# Patient Record
Sex: Female | Born: 1970 | Race: Black or African American | Hispanic: No | Smoking: Never smoker
Health system: Southern US, Community
[De-identification: ages and names within clinical notes are randomized; demographics above are authoritative.]

## PROBLEM LIST (undated history)

## (undated) DIAGNOSIS — I10 Essential (primary) hypertension: Secondary | ICD-10-CM

## (undated) DIAGNOSIS — Z87442 Personal history of urinary calculi: Secondary | ICD-10-CM

## (undated) HISTORY — PX: OVARIAN CYST REMOVAL: SHX89

## (undated) HISTORY — PX: DILATION AND CURETTAGE OF UTERUS: SHX78

---

## 1994-06-13 HISTORY — PX: BUNIONECTOMY: SHX129

## 1998-06-29 ENCOUNTER — Other Ambulatory Visit: Admission: RE | Admit: 1998-06-29 | Discharge: 1998-06-29 | Payer: Self-pay | Admitting: Obstetrics and Gynecology

## 1999-06-01 ENCOUNTER — Emergency Department (HOSPITAL_COMMUNITY): Admission: EM | Admit: 1999-06-01 | Discharge: 1999-06-01 | Payer: Self-pay | Admitting: *Deleted

## 2000-03-14 ENCOUNTER — Other Ambulatory Visit: Admission: RE | Admit: 2000-03-14 | Discharge: 2000-03-14 | Payer: Self-pay | Admitting: Obstetrics and Gynecology

## 2000-04-14 ENCOUNTER — Emergency Department (HOSPITAL_COMMUNITY): Admission: EM | Admit: 2000-04-14 | Discharge: 2000-04-15 | Payer: Self-pay | Admitting: Internal Medicine

## 2000-06-10 ENCOUNTER — Inpatient Hospital Stay (HOSPITAL_COMMUNITY): Admission: AD | Admit: 2000-06-10 | Discharge: 2000-06-10 | Payer: Self-pay | Admitting: Obstetrics and Gynecology

## 2000-09-14 ENCOUNTER — Inpatient Hospital Stay (HOSPITAL_COMMUNITY): Admission: AD | Admit: 2000-09-14 | Discharge: 2000-09-14 | Payer: Self-pay | Admitting: *Deleted

## 2000-10-03 ENCOUNTER — Inpatient Hospital Stay (HOSPITAL_COMMUNITY): Admission: AD | Admit: 2000-10-03 | Discharge: 2000-10-05 | Payer: Self-pay | Admitting: Obstetrics and Gynecology

## 2000-10-07 ENCOUNTER — Encounter: Admission: RE | Admit: 2000-10-07 | Discharge: 2000-11-06 | Payer: Self-pay | Admitting: Obstetrics and Gynecology

## 2000-12-07 ENCOUNTER — Encounter: Admission: RE | Admit: 2000-12-07 | Discharge: 2001-01-06 | Payer: Self-pay | Admitting: Obstetrics and Gynecology

## 2002-02-01 ENCOUNTER — Other Ambulatory Visit: Admission: RE | Admit: 2002-02-01 | Discharge: 2002-02-01 | Payer: Self-pay | Admitting: Obstetrics and Gynecology

## 2003-06-14 HISTORY — PX: TUBAL LIGATION: SHX77

## 2003-09-22 ENCOUNTER — Other Ambulatory Visit: Admission: RE | Admit: 2003-09-22 | Discharge: 2003-09-22 | Payer: Self-pay | Admitting: *Deleted

## 2003-11-11 ENCOUNTER — Encounter (INDEPENDENT_AMBULATORY_CARE_PROVIDER_SITE_OTHER): Payer: Self-pay | Admitting: *Deleted

## 2003-11-11 ENCOUNTER — Inpatient Hospital Stay (HOSPITAL_COMMUNITY): Admission: RE | Admit: 2003-11-11 | Discharge: 2003-11-14 | Payer: Self-pay | Admitting: *Deleted

## 2003-11-11 ENCOUNTER — Encounter (INDEPENDENT_AMBULATORY_CARE_PROVIDER_SITE_OTHER): Payer: Self-pay | Admitting: Specialist

## 2005-06-13 HISTORY — PX: ABLATION: SHX5711

## 2005-09-13 ENCOUNTER — Other Ambulatory Visit: Admission: RE | Admit: 2005-09-13 | Discharge: 2005-09-13 | Payer: Self-pay | Admitting: Obstetrics & Gynecology

## 2005-10-05 ENCOUNTER — Ambulatory Visit (HOSPITAL_COMMUNITY): Admission: RE | Admit: 2005-10-05 | Discharge: 2005-10-05 | Payer: Self-pay | Admitting: Obstetrics & Gynecology

## 2005-12-06 ENCOUNTER — Ambulatory Visit (HOSPITAL_BASED_OUTPATIENT_CLINIC_OR_DEPARTMENT_OTHER): Admission: RE | Admit: 2005-12-06 | Discharge: 2005-12-06 | Payer: Self-pay | Admitting: Obstetrics & Gynecology

## 2006-09-15 ENCOUNTER — Other Ambulatory Visit: Admission: RE | Admit: 2006-09-15 | Discharge: 2006-09-15 | Payer: Self-pay | Admitting: Obstetrics & Gynecology

## 2008-03-02 ENCOUNTER — Emergency Department (HOSPITAL_COMMUNITY): Admission: EM | Admit: 2008-03-02 | Discharge: 2008-03-02 | Payer: Self-pay | Admitting: Emergency Medicine

## 2008-03-10 ENCOUNTER — Other Ambulatory Visit: Admission: RE | Admit: 2008-03-10 | Discharge: 2008-03-10 | Payer: Self-pay | Admitting: Obstetrics & Gynecology

## 2008-07-26 ENCOUNTER — Emergency Department (HOSPITAL_COMMUNITY): Admission: EM | Admit: 2008-07-26 | Discharge: 2008-07-26 | Payer: Self-pay | Admitting: Emergency Medicine

## 2009-03-26 ENCOUNTER — Encounter: Admission: RE | Admit: 2009-03-26 | Discharge: 2009-03-26 | Payer: Self-pay | Admitting: Obstetrics and Gynecology

## 2010-01-24 ENCOUNTER — Emergency Department (HOSPITAL_COMMUNITY): Admission: EM | Admit: 2010-01-24 | Discharge: 2010-01-24 | Payer: Self-pay | Admitting: Emergency Medicine

## 2010-03-31 ENCOUNTER — Encounter: Admission: RE | Admit: 2010-03-31 | Discharge: 2010-03-31 | Payer: Self-pay | Admitting: Obstetrics and Gynecology

## 2010-10-29 NOTE — H&P (Signed)
NAME:  CHRISTEE, MERVINE                     ACCOUNT NO.:  [id number]   MEDICAL RECORD NO.:  [id number]                   PATIENT TYPE:  INP   LOCATION:  NA                                   FACILITY:  [facility]   PHYSICIAN:  Dineen Kid. Rana Snare, M.D.                 DATE OF BIRTH:  18-Mar-1971   DATE OF ADMISSION:  DATE OF DISCHARGE:                                HISTORY & PHYSICAL   HISTORY OF PRESENT ILLNESS:  Ms. Vanengen is a 40 year old G3, P2, A1, who  presents for surgical evaluation and treatment of a right ovarian mass.  She  presented to Dr. Tracie Harrier with menorrhagia, who found a pelvic mass  on a pelvic ultrasound, which showed an 11.7 x 6.5 x 11.0-cm pelvic mass  which was suspicious for a dermoid tumor.  She presents for definitive  surgical evaluation and removal of this tumor.  Her CA125 was 8.8.  She has  had a pressure discomfort.  She has no childbearing desires and also wishes  left tubal ligation at the same time.   PAST MEDICAL HISTORY:  Her past medical history is negative __________.   DICTATION ENDED AT THIS POINT.                                               Dineen Kid Rana Snare, M.D.    DCL/MEDQ  D:  11/10/2003  T:  11/11/2003  Job:  [id number]

## 2010-10-29 NOTE — Op Note (Signed)
[name], [name]           ACCOUNT NO.:  [id number]   MEDICAL RECORD NO.:  [id number]          PATIENT TYPE:  AMB   LOCATION:  [facility]                         FACILITY:  [facility]   PHYSICIAN:  M. Leda Quail, MD  DATE OF BIRTH:  [birth year]   DATE OF PROCEDURE:  12/06/2005  DATE OF DISCHARGE:                                 OPERATIVE REPORT   PREOPERATIVE DIAGNOSES:  1.  The patient is a 40 year old, G3, P2, A1, African-American female with      menorrhagia.  2.  Status post a bilateral tubal ligation.  3.  Normal ultrasound with a negative endometrial biopsy.   POSTOPERATIVE DIAGNOSES:  1.  The patient is a 40 year old, G3, P2, A1, African-American female with      menorrhagia.  2.  Status post a bilateral tubal ligation.  3.  Normal ultrasound with a negative endometrial biopsy.   PROCEDURE:  Hydrothermal endometrial ablation.   SURGEON:  M. Leda Quail, MD.   ANESTHESIA:  General endotracheal anesthesia.   SPECIMENS:  None.   ESTIMATED BLOOD LOSS:  Minimal.   URINE OUTPUT:  50 mL of clear urine drained with an in and out Foley  catheter at the beginning of the case.   FLUIDS:  800 mL of LR administered through the IV.   COMPLICATIONS:  None.   INDICATIONS FOR PROCEDURE:  [name] is a 40 year old, G3, P2, A1  African-American female who has had a tubal ligation. She initially saw me  several months ago reporting heavy menstrual cycles, passing clots and  worsening length of flow. Ultrasound was performed which was essentially  negative. She had a slightly thickened endometrium. An endometrial biopsy  was performed which was benign. Treatment options were discussed with the  patient and she opted for an endometrial ablation. She has been on birth  control pills in the past and did not want to resume these. She was  pretreated with Depot Lupron and then a preop ultrasound was performed and  showed continued mildly thickened endometrium so Aygestin was  also  initiated. She was aware that if the endometrium appeared very fluffy today  that I would not proceed with the procedure.   DESCRIPTION OF PROCEDURE:  The patient was taken to the operating room, she  was placed in the supine position. A running IV was then placed. Informed  consent was present on the chart. General endotracheal anesthesia was  administered by anesthesia staff without difficulty. Legs were positioned in  the low lithotomy position in [place] stirrups. Once the legs were positioned  correct, then legs were positioned in the high lithotomy position. The  perineum, inner thighs and vagina were prepped and draped in a normal  sterile fashion. A red rubber Foley catheter was used to drain the bladder  of all urine. A bivalve speculum was placed in the vagina and cervix was  well visualized. A tenaculum was applied to the anterior lip of the cervix.  The uterus sounded to 8.5 cm. Then using Pratt dilators, the cervix was  dilated up to a #25. The cervix did have a tight rubbery consistency to it.  Then the hysteroscope was advanced into the endocervical canal. There was a  tight seal around the scope. The tubal ostia were noted bilaterally and  photo documentation was made. A flush cycle with the hysteroscope and HTA  apparatus was performed. The hysteroscope was placed in the lower uterine  segment.  There was no fluid leaking from the cervix noted. At this point,  the system filled and the reservoir was at 80 mL. Then a heating cycle was  performed and the saline solution used for the procedure was heated to 90  degrees Celsius. Then a 10 minute ablation was performed. Direct  visualization of the endometrial cavity was observed throughout the entire  ablative process. Excellent blanching of the endometrium was noted. There  were no complications during the ablation. The reservoir stayed completely  stable at 80 mL and there was no leaking of fluid from the cervix noted.   Once a 10 minute ablative cycle was complete then a 2 minute cooling cycle  was performed. At this point, the hysteroscope was removed from the cervix.  Photo documentation was made prior to removal of the hysteroscope. The  tenaculum was removed from the anterior lip of the cervix and there was a  small amount of the bleeding from the tenaculum sites which was made  hemostatic easily. The speculum was then removed from the vagina and the  patient was positioned back in supine position.   Sponge, lap and instrument counts were correct x2. No needles were used  during the procedure. The patient tolerated the procedure well and was taken  to the recovery room in stable condition.      Lum Keas, MD  Electronically Signed     MSM/MEDQ  D:  12/06/2005  T:  12/06/2005  Job:  [id number]

## 2010-10-29 NOTE — H&P (Signed)
NAME:  [name], [name]                     ACCOUNT NO.:  [id number]   MEDICAL RECORD NO.:  [id number]                   PATIENT TYPE:  INP   LOCATION:  NA                                   FACILITY:  [facility]   PHYSICIAN:  Dineen Kid. Rana Snare, M.D.                 DATE OF BIRTH:  1971/06/06   DATE OF ADMISSION:  11/11/2003  DATE OF DISCHARGE:                                HISTORY & PHYSICAL   HISTORY OF PRESENT ILLNESS:  [name] is a 40 year old G3, P2, A1 who  presents for surgical evaluation and removal of a right ovarian mass.  The  patient initially presented to Dr. Malachy Mood for evaluation of menorrhagia.  Pelvic exam revealed an enlarged pelvic mass.  Ultrasound was performed  showing an 11.5 cm complex mass suspicious for a dermoid.  CA-125 was 8.8.  She presents for definitive surgical evaluation and treatment of this.  She  also desires sterility and planned left tubal ligation at the same time.   PAST MEDICAL HISTORY:  The patient's past medical history is negative.   PAST SURGICAL HISTORY:  History of bone spur removal and also D&E with a  miscarriage.   PAST OBSTETRICAL HISTORY:  Two vaginal deliveries.   MEDICATIONS:  None.   ALLERGIES:  She has no known drug allergies.   PHYSICAL EXAMINATION:  VITAL SIGNS:  Blood pressure is 112/90.  HEART:  Regular rate and rhythm.  LUNGS:  Clear to auscultation bilaterally.  ABDOMEN:  Nondistended, nontender.  PELVIC:  Pelvic mass.  Globular, approximately 8 weeks size.   LABORATORY DATA:  Ultrasound shows a right adnexal mass measuring 11.7 x 6.5  x 11.0 complex mass filled with low level echoes containing solid areas  suspicious for dermoid.  The left ovary is within normal limits; no ascites  is noted.  Again a CA-125 was 8.8.  Pap smear was normal.   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   PLAN:  Laparotomy with removal of right tube and ovary.  The risks and  benefits of the procedure were discussed which  include but are not limited  to risks of infection, bleeding, damage to bowel, bladder, ureters, ovary,  possibility of further surgery for staging procedure if this does turn out  to be cancer.  The patient also understands that we may perform a staging  procedure if this is cancer at the time or indicated surgeries.  She  understands the risks of infection and also of bleeding and risks of  associated blood transfusion.  She also desires sterility and planned left  tubal ligation at the time of the procedure.  She understands that there is  a 10/998 failure rate of tubal ligation.  She does give her informed consent  and wishes to proceed.  Dineen Kid Rana Snare, M.D.   DCL/MEDQ  D:  11/10/2003  T:  11/10/2003  Job:  [id number]

## 2010-10-29 NOTE — Op Note (Signed)
NAME:  [name], [name]                     ACCOUNT NO.:  [id number]   MEDICAL RECORD NO.:  [id number]                   PATIENT TYPE:  INP   LOCATION:  NA                                   FACILITY:  [facility]   PHYSICIAN:  Dineen Kid. Rana Snare, M.D.                 DATE OF BIRTH:  05/19/1971   DATE OF PROCEDURE:  11/11/2003  DATE OF DISCHARGE:                                 OPERATIVE REPORT   PREOPERATIVE DIAGNOSES:  1. 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  2. Desired sterility.   POSTOPERATIVE DIAGNOSES:  1. 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  2. Desired sterility.   PROCEDURE:  Laparotomy with right salpingo-oophorectomy and left tubal  ligation.   SURGEON:  Dineen Kid. Rana Snare, M.D.   ASSISTANTFreddy Finner, M.D.   ANESTHESIA:  General endotracheal.   INDICATIONS:  Ms. Satcher is a 40 year old G3, P2, A1, who was found to  have a right ovarian mass by ultrasound with a normal CA125, 8.8.  Ultrasound shows an 11.6 x 5.5 x 11.0 complex mass suspicious for a dermoid.  She presents for surgical evaluation and removal of this and planned right  salpingo-oophorectomy.  The patient also desires sterility.  Plan left tubal  ligation.  The patient understands the risks and benefits of surgery and  also tubal failure quoted as five out of 1000.  She gives her informed  consent.   Findings at the time of surgery were a large right ovarian mass consistent  with dermoid, approximately 11-12 cm in size, normal-appearing left ovary,  left tube, uterus, cul-de-sac.   SPECIMENS:  Right tube and ovary, portion of left fallopian tube.   DESCRIPTION OF PROCEDURE:  After adequate anesthesia, the patient placed in  the supine position.  She was sterilely prepped and draped.  A Foley  catheter was sterilely placed.  A Pfannenstiel incision was made two  fingerbreadths above the pubic symphysis, taken down sharply to the fascia,  incised transversely and extended  superiorly, then inferiorly off the  bellies of the rectus muscle, which were separated sharply in the midline.  The peritoneum was entered sharply, pelvic washings were obtained.  A large  right ovarian mass was noted.  The right uterosacral ligament was  identified, clamped with a Kelly clamp, doubly ligated with 0 Vicryl.  The  right infundibulopelvic ligament was identified, clamped with a Kelly clamp,  and then cut and ligated with a 0 Vicryl suture, and the remaining portion  of the pedicle was then clamped and cut and that ovary and tube were removed  intact and sent off to pathology.  The pedicle was ligated with 0 Vicryl  suture, noted to be hemostatic.  The ureter was noted to be well away from  the pedicle.  The left fallopian tube was identified by the fimbriated end,  the midportion of the tube grasped with a Babcock clamp.  A modified Pomeroy-  type tubal was performed with two sutures of 0 plain, central portion of the  tube excised and sent to pathology.  The tubal ostia were made hemostatic  with Bovie cautery.  The proximal portion of the tube was ligated with 2-0  silk.  Irrigation was applied and after adequate hemostasis of the pedicles  was assured, packing was removed, the O'Connor-O'Sullivan retractor was then  removed.  The peritoneum was then closed with 2-0 Vicryl in a running  fashion.  Rectus muscles were plicated in the midline.  The fascia was then  closed with a #1 Vicryl in running fashion, irrigation was applied, and  after adequate hemostasis the skin was stapled and Steri-Strips applied.  The patient tolerated the procedure well and was stable on transfer to the  recovery room.  The sponge and instrument count was normal x3.  The patient  received 1 g of Rocephin preoperatively.  Estimated blood loss 25 mL.                                               Dineen Kid Rana Snare, M.D.    DCL/MEDQ  D:  11/11/2003  T:  11/11/2003  Job:  [id number]

## 2010-10-29 NOTE — Discharge Summary (Signed)
NAME:  [name], [name]                     ACCOUNT NO.:  [id number]   MEDICAL RECORD NO.:  [id number]                   PATIENT TYPE:  INP   LOCATION:  9309                                 FACILITY:  [facility]   PHYSICIAN:  Dineen Kid. Rana Snare, M.D.                 DATE OF BIRTH:  1970-09-22   DATE OF ADMISSION:  11/11/2003  DATE OF DISCHARGE:  11/14/2003                                 DISCHARGE SUMMARY   HISTORY OF PRESENT ILLNESS:  [name] is a 40 year old G3, P2, A1 who  presents for surgical evaluation and removal of right ovarian mass. Pelvic  examination per Dr. Malachy Mood on presentation to the office was significant for  enlarged pelvic mass. Ultrasound shows a 11.5 cm complex mass, suspicious  for a dermoid. A normal CA125. She presents for definitive surgical  evaluation and treatment of this. She also plans left tubal  ligation and  she desires sterility.   HOSPITAL COURSE:  The patient underwent laparotomy with right salpingo-  oophorectomy for a 12 cm ovarian cyst, which is consistent with a dermoid.  The left ovary was normal at that time. The left tubal ligation was  performed. The surgery was uncomplicated with the estimated blood loss 25  cc. Her postoperative course was unremarkable with good return of bowel  function. Her postoperative hemoglobin was 10.2. By postoperative day 3, she  was tolerating a regular diet and ambulating without difficulty. The  incision was clean, dry and intact and she was discharged home.   DISPOSITION:  The patient will be discharged home.   FOLLOW UP:  In the office in 2 weeks.   DISCHARGE INSTRUCTIONS:  Sent home with routine instruction sheet for  laparotomy. Told to return for increased pain, fever, or bleeding. Staples  were removed before discharge.   DISCHARGE MEDICATIONS:  She was give a prescription for Tylox #30.                                               Dineen Kid Rana Snare, M.D.    DCL/MEDQ  D:  11/14/2003  T:  11/16/2003   Job:  [id number]   cc:   Tracie Harrier, M.D.  [address] [place]  [place] [address]  Fax: [phone]

## 2011-06-14 ENCOUNTER — Emergency Department (HOSPITAL_COMMUNITY)
Admission: EM | Admit: 2011-06-14 | Discharge: 2011-06-14 | Disposition: A | Discharge status: Home / Self Care | Payer: BC Managed Care – PPO | Source: Home / Self Care | Attending: Family Medicine | Admitting: Family Medicine

## 2011-06-14 ENCOUNTER — Encounter: Payer: Self-pay | Admitting: *Deleted

## 2011-06-14 DIAGNOSIS — I781 Nevus, non-neoplastic: Secondary | ICD-10-CM

## 2011-06-14 NOTE — ED Notes (Signed)
Left lower lip ? Cold sore onset x several weeks more painful this am

## 2011-06-14 NOTE — ED Provider Notes (Signed)
History     CSN: [id number]  Arrival date & time 06/14/11  1050   First MD Initiated Contact with Patient 06/14/11 1121      Chief Complaint  Patient presents with  . Mouth Lesions    (Consider location/radiation/quality/duration/timing/severity/associated sxs/prior treatment) Patient is a 41 y.o. female presenting with rash. The history is provided by the patient.  Rash  This is a new problem. The current episode started more than 1 week ago (2-3 weeks). The problem has been gradually worsening. The problem is associated with an unknown factor. There has been no fever. The rash is present on the lips. The pain is mild. Associated symptoms include pain.    History reviewed. No pertinent past medical history.  Past Surgical History  Procedure Date  . Ovarian cyst removal     History reviewed. No pertinent family history.  History  Substance Use Topics  . Smoking status: Current Everyday Smoker  . Smokeless tobacco: Not on file  . Alcohol Use: Yes    OB History    Grav Para Term Preterm Abortions TAB SAB Ect Mult Living                  Review of Systems  Constitutional: Negative.   HENT: Positive for mouth sores.   Skin: Positive for rash.    Allergies  Review of patient's allergies indicates no known allergies.  Home Medications   Current Outpatient Rx  Name Route Sig Dispense Refill  . VITAMIN D 1000 UNITS PO TABS Oral Take 1,000 Units by mouth daily.      . ADULT MULTIVITAMIN W/MINERALS CH Oral Take 1 tablet by mouth daily.        BP 135/92  Pulse 76  Temp(Src) 98.7 F (37.1 C) (Oral)  Resp 16  SpO2 100%  Physical Exam  Nursing note and vitals reviewed. Constitutional: She appears well-developed and well-nourished.  HENT:  Head: Normocephalic.  Left Ear: External ear normal.  Mouth/Throat: Oropharynx is clear and moist.  Neck: Normal range of motion. Neck supple.  Lymphadenopathy:    She has no cervical adenopathy.  Skin: Skin is warm and  dry.       ED Course  Procedures (including critical care time)  Labs Reviewed - No data to display No results found.   1. Capillary angioma       MDM          Barkley Bruns, MD 06/14/11 1320

## 2011-06-15 ENCOUNTER — Other Ambulatory Visit: Payer: Self-pay | Admitting: Dermatology

## 2012-06-14 ENCOUNTER — Other Ambulatory Visit: Payer: Self-pay | Admitting: Obstetrics and Gynecology

## 2012-06-14 DIAGNOSIS — Z1231 Encounter for screening mammogram for malignant neoplasm of breast: Secondary | ICD-10-CM

## 2012-06-15 ENCOUNTER — Ambulatory Visit
Admission: RE | Admit: 2012-06-15 | Discharge: 2012-06-15 | Disposition: A | Discharge status: Home / Self Care | Payer: BC Managed Care – PPO | Source: Ambulatory Visit | Attending: Obstetrics and Gynecology | Admitting: Obstetrics and Gynecology

## 2012-06-15 DIAGNOSIS — Z1231 Encounter for screening mammogram for malignant neoplasm of breast: Secondary | ICD-10-CM

## 2013-05-29 ENCOUNTER — Other Ambulatory Visit: Payer: Self-pay

## 2013-05-29 DIAGNOSIS — Z1231 Encounter for screening mammogram for malignant neoplasm of breast: Secondary | ICD-10-CM

## 2013-06-17 ENCOUNTER — Ambulatory Visit
Admission: RE | Admit: 2013-06-17 | Discharge: 2013-06-17 | Disposition: A | Discharge status: Home / Self Care | Payer: BC Managed Care – PPO | Source: Ambulatory Visit

## 2013-06-17 DIAGNOSIS — Z1231 Encounter for screening mammogram for malignant neoplasm of breast: Secondary | ICD-10-CM

## 2013-10-20 ENCOUNTER — Emergency Department (HOSPITAL_COMMUNITY)
Admission: EM | Admit: 2013-10-20 | Discharge: 2013-10-20 | Disposition: A | Discharge status: Home / Self Care | Payer: BC Managed Care – PPO | Attending: Emergency Medicine | Admitting: Emergency Medicine

## 2013-10-20 ENCOUNTER — Encounter (HOSPITAL_COMMUNITY): Payer: Self-pay | Admitting: Emergency Medicine

## 2013-10-20 ENCOUNTER — Emergency Department (HOSPITAL_COMMUNITY): Payer: BC Managed Care – PPO

## 2013-10-20 DIAGNOSIS — Y9389 Activity, other specified: Secondary | ICD-10-CM | POA: Insufficient documentation

## 2013-10-20 DIAGNOSIS — S91309A Unspecified open wound, unspecified foot, initial encounter: Secondary | ICD-10-CM | POA: Insufficient documentation

## 2013-10-20 DIAGNOSIS — Y929 Unspecified place or not applicable: Secondary | ICD-10-CM | POA: Insufficient documentation

## 2013-10-20 DIAGNOSIS — W268XXA Contact with other sharp object(s), not elsewhere classified, initial encounter: Secondary | ICD-10-CM | POA: Insufficient documentation

## 2013-10-20 DIAGNOSIS — S91331A Puncture wound without foreign body, right foot, initial encounter: Secondary | ICD-10-CM

## 2013-10-20 NOTE — ED Notes (Signed)
Pt was cleaning son's room and stepped on a piece of glass.

## 2013-10-20 NOTE — Discharge Instructions (Signed)
Keep wound clean with soap and warm water. Follow up with your primary care physician. Return to the ED for new concerns.

## 2013-10-20 NOTE — ED Provider Notes (Signed)
CSN: [id number]     Arrival date & time 10/20/13  1752 History  This chart was scribed for non-physician practitioner Sharilyn SitesLisa Lourene Hoston, PA-C, working with Rolland PorterMark James, MD, by Yevette EdwardsAngela Bracken, ED Scribe. This patient was seen in room WTR5/WTR5 and the patient's care was started at 7:06 PM.   First MD Initiated Contact with Patient 10/20/13 1856     Chief Complaint  Patient presents with  . Extremity Laceration    The history is provided by the patient. No language interpreter was used.   HPI Comments: Mckenzie PaciniMichelle A Holmes is a 43 y.o. female who presents to the Emergency Department complaining of a puncture wound to the sole of her right foot which occurred this afternoon when she stepped on a piece of glass as she was cleaning her son's room. The bleeding is controlled. She is able to move her toes and she denies loss of sensation to her foot. Her last tetanus booster was approximately four years ago.   History reviewed. No pertinent past medical history. Past Surgical History  Procedure Laterality Date  . Ovarian cyst removal     History reviewed. No pertinent family history. History  Substance Use Topics  . Smoking status: Never Smoker   . Smokeless tobacco: Not on file  . Alcohol Use: Yes    No OB history provided.  Review of Systems  Constitutional: Negative for fever.  Skin: Positive for wound.  All other systems reviewed and are negative.   Allergies  Tomato  Home Medications   Prior to Admission medications   Medication Sig Start Date End Date Taking? Authorizing Provider  Multiple Vitamin (MULITIVITAMIN WITH MINERALS) TABS Take 1 tablet by mouth daily.     Yes Historical Provider, MD   Triage Vitals: BP 145/90  Pulse 85  Temp(Src) 98.2 F (36.8 C) (Oral)  Resp 18  SpO2 98%  Physical Exam  Nursing note and vitals reviewed. Constitutional: She is oriented to person, place, and time. She appears well-developed and well-nourished. No distress.  HENT:  Head:  Normocephalic and atraumatic.  Eyes: EOM are normal.  Neck: Neck supple. No tracheal deviation present.  Cardiovascular: Normal rate.   Pulmonary/Chest: Effort normal. No respiratory distress.  Musculoskeletal: Normal range of motion.  Neurological: She is alert and oriented to person, place, and time.  Skin: Skin is warm and dry.  Right sole of foot with small puncture wound; surrounding tissue tender to palpation. No foreign body or active bleeding. No drainage.   Psychiatric: She has a normal mood and affect. Her behavior is normal.    ED Course  Procedures (including critical care time)  DIAGNOSTIC STUDIES: Oxygen Saturation is 98% on room air, normal by my interpretation.    COORDINATION OF CARE:  7:10 PM- Discussed treatment plan with patient, and the patient agreed to the plan. The plan includes imaging.  Labs Review Labs Reviewed - No data to display  Imaging Review Dg Foot Complete Right  10/20/2013   CLINICAL DATA:  Stepped on a piece of glass, laceration to the right foot.  EXAM: RIGHT FOOT COMPLETE - 3+ VIEW  COMPARISON:  None.  FINDINGS: No evidence of acute fracture or dislocation. Mild hallux valgus and mild soft tissue swelling at the level of the 1st MTP joint laterally. No visible opaque foreign body in the soft tissues to suggest a glass shard. Wire suture involving the proximal phalanx of the great toe. Joint spaces well preserved. Bone mineral density well-preserved.  IMPRESSION: No acute osseous abnormality.  No opaque foreign bodies in the soft tissues.   Electronically Signed   By: Hulan Saashomas  Lawrence M.D.   On: 10/20/2013 18:34     EKG Interpretation None      MDM   Final diagnoses:  Puncture wound of right foot   His tetanus is up to date. X-ray negative for retained foreign bodies. On exam wound is clean without signs of infection.  Wound was cleaned and bandaged.  Instructed on home wound care.  FU with PCP if problems occur.  Discussed plan with  patient, he/she acknowledged understanding and agreed with plan of care.  Return precautions given for new or worsening symptoms.  I personally performed the services described in this documentation, which was scribed in my presence. The recorded information has been reviewed and is accurate.  Garlon HatchetLisa M Rhyder Koegel, PA-C 10/20/13 [phone]

## 2013-10-23 NOTE — ED Provider Notes (Signed)
Medical screening examination/treatment/procedure(s) were performed by non-physician practitioner and as supervising physician I was immediately available for consultation/collaboration.   EKG Interpretation None        Jaimeson Gopal, MD 10/23/13 0833 

## 2014-07-24 ENCOUNTER — Other Ambulatory Visit: Payer: Self-pay | Admitting: Dermatology

## 2014-11-24 ENCOUNTER — Ambulatory Visit: Payer: BC Managed Care – PPO | Admitting: Dietician

## 2015-05-14 ENCOUNTER — Ambulatory Visit: Payer: BC Managed Care – PPO | Admitting: Internal Medicine

## 2015-05-14 DIAGNOSIS — Z0289 Encounter for other administrative examinations: Secondary | ICD-10-CM

## 2015-06-29 ENCOUNTER — Telehealth: Payer: Self-pay | Admitting: General Practice

## 2015-06-29 NOTE — Telephone Encounter (Signed)
Patient did not come in for December establish appointment with Okey Duprerawford.  Is asking to waive 50 service charge fee.

## 2015-07-03 NOTE — Telephone Encounter (Signed)
We havent charged her at this point

## 2015-07-09 NOTE — Telephone Encounter (Signed)
Notified patient that fee must hit billing before we can waive.

## 2015-07-28 ENCOUNTER — Ambulatory Visit: Payer: Self-pay | Admitting: Licensed Clinical Social Worker

## 2015-08-06 ENCOUNTER — Ambulatory Visit (INDEPENDENT_AMBULATORY_CARE_PROVIDER_SITE_OTHER): Payer: BC Managed Care – PPO | Admitting: Internal Medicine

## 2015-08-06 ENCOUNTER — Other Ambulatory Visit (INDEPENDENT_AMBULATORY_CARE_PROVIDER_SITE_OTHER): Payer: BC Managed Care – PPO

## 2015-08-06 ENCOUNTER — Encounter: Payer: Self-pay | Admitting: Internal Medicine

## 2015-08-06 VITALS — BP 128/92 | HR 89 | Temp 98.6°F | Resp 16 | Ht 66.0 in | Wt 203.1 lb

## 2015-08-06 DIAGNOSIS — Z0000 Encounter for general adult medical examination without abnormal findings: Secondary | ICD-10-CM

## 2015-08-06 DIAGNOSIS — E669 Obesity, unspecified: Secondary | ICD-10-CM

## 2015-08-06 DIAGNOSIS — I10 Essential (primary) hypertension: Secondary | ICD-10-CM | POA: Insufficient documentation

## 2015-08-06 DIAGNOSIS — J302 Other seasonal allergic rhinitis: Secondary | ICD-10-CM | POA: Diagnosis not present

## 2015-08-06 DIAGNOSIS — J309 Allergic rhinitis, unspecified: Secondary | ICD-10-CM | POA: Insufficient documentation

## 2015-08-06 DIAGNOSIS — R030 Elevated blood-pressure reading, without diagnosis of hypertension: Secondary | ICD-10-CM | POA: Diagnosis not present

## 2015-08-06 LAB — LIPID PANEL
Cholesterol: 227 mg/dL — ABNORMAL HIGH (ref 0–200)
HDL: 44.9 mg/dL (ref >39.00)
LDL Cholesterol: 166 mg/dL — ABNORMAL HIGH (ref 0–99)
NonHDL: 181.88
Total CHOL/HDL Ratio: 5
Triglycerides: 81 mg/dL (ref 0.0–149.0)
VLDL: 16.2 mg/dL (ref 0.0–40.0)

## 2015-08-06 LAB — COMPREHENSIVE METABOLIC PANEL
ALT: 15 U/L (ref 0–35)
AST: 26 U/L (ref 0–37)
Albumin: 4.4 g/dL (ref 3.5–5.2)
Alkaline Phosphatase: 57 U/L (ref 39–117)
BUN: 18 mg/dL (ref 6–23)
CO2: 27 mEq/L (ref 19–32)
Calcium: 9.5 mg/dL (ref 8.4–10.5)
Chloride: 102 mEq/L (ref 96–112)
Creatinine, Ser: 0.94 mg/dL (ref 0.40–1.20)
GFR: 82.22 mL/min (ref >60.00)
Glucose, Bld: 92 mg/dL (ref 70–99)
Potassium: 4.8 mEq/L (ref 3.5–5.1)
Sodium: 137 mEq/L (ref 135–145)
Total Bilirubin: 0.6 mg/dL (ref 0.2–1.2)
Total Protein: 8.4 g/dL — ABNORMAL HIGH (ref 6.0–8.3)

## 2015-08-06 LAB — CBC
HCT: 39.7 % (ref 36.0–46.0)
Hemoglobin: 13.5 g/dL (ref 12.0–15.0)
MCHC: 34.1 g/dL (ref 30.0–36.0)
MCV: 90.6 fl (ref 78.0–100.0)
Platelets: 418 10*3/uL — ABNORMAL HIGH (ref 150.0–400.0)
RBC: 4.38 Mil/uL (ref 3.87–5.11)
RDW: 12.5 % (ref 11.5–15.5)
WBC: 4.6 10*3/uL (ref 4.0–10.5)

## 2015-08-06 LAB — HEMOGLOBIN A1C: Hgb A1c MFr Bld: 4.7 % (ref 4.6–6.5)

## 2015-08-06 LAB — TSH: TSH: 1.78 u[IU]/mL (ref 0.35–4.50)

## 2015-08-06 MED ORDER — FLUTICASONE PROPIONATE 50 MCG/ACT NA SUSP: 2.0000 | Freq: Every day | NASAL | Status: DC

## 2015-08-06 NOTE — Progress Notes (Signed)
Pre visit review using our clinic review tool, if applicable. No additional management support is needed unless otherwise documented below in the visit note. 

## 2015-08-06 NOTE — Assessment & Plan Note (Signed)
Talked to her about diet and exercise needs to help her lose weight. She will work on more exercise.

## 2015-08-06 NOTE — Patient Instructions (Signed)
We have sent in the flonase which is the nose spray for the allergies. Use 2 sprays in each nostril daily during your allergy season.  We would like to see you back in about 6 months to check on the blood pressure.   To lose weight the recommended amount of exercise is 4-5 times per week for 45 minutes per time.   Health Maintenance, Female Adopting a healthy lifestyle and getting preventive care can go a long way to promote health and wellness. Talk with your health care provider about what schedule of regular examinations is right for you. This is a good chance for you to check in with your provider about disease prevention and staying healthy. In between checkups, there are plenty of things you can do on your own. Experts have done a lot of research about which lifestyle changes and preventive measures are most likely to keep you healthy. Ask your health care provider for more information. WEIGHT AND DIET  Eat a healthy diet  Be sure to include plenty of vegetables, fruits, low-fat dairy products, and lean protein.  Do not eat a lot of foods high in solid fats, added sugars, or salt.  Get regular exercise. This is one of the most important things you can do for your health.  Most adults should exercise for at least 150 minutes each week. The exercise should increase your heart rate and make you sweat (moderate-intensity exercise).  Most adults should also do strengthening exercises at least twice a week. This is in addition to the moderate-intensity exercise.  Maintain a healthy weight  Body mass index (BMI) is a measurement that can be used to identify possible weight problems. It estimates body fat based on height and weight. Your health care provider can help determine your BMI and help you achieve or maintain a healthy weight.  For females 26 years of age and older:   A BMI below 18.5 is considered underweight.  A BMI of 18.5 to 24.9 is normal.  A BMI of 25 to 29.9 is  considered overweight.  A BMI of 30 and above is considered obese.  Watch levels of cholesterol and blood lipids  You should start having your blood tested for lipids and cholesterol at 47 years of age, then have this test every 5 years.  You may need to have your cholesterol levels checked more often if:  Your lipid or cholesterol levels are high.  You are older than 47 years of age.  You are at high risk for heart disease.  CANCER SCREENING   Lung Cancer  Lung cancer screening is recommended for adults 26-36 years old who are at high risk for lung cancer because of a history of smoking.  A yearly low-dose CT scan of the lungs is recommended for people who:  Currently smoke.  Have quit within the past 15 years.  Have at least a 30-pack-year history of smoking. A pack year is smoking an average of one pack of cigarettes a day for 1 year.  Yearly screening should continue until it has been 15 years since you quit.  Yearly screening should stop if you develop a health problem that would prevent you from having lung cancer treatment.  Breast Cancer  Practice breast self-awareness. This means understanding how your breasts normally appear and feel.  It also means doing regular breast self-exams. Let your health care provider know about any changes, no matter how small.  If you are in your 20s or 30s, you  should have a clinical breast exam (CBE) by a health care provider every 1-3 years as part of a regular health exam.  If you are 21 or older, have a CBE every year. Also consider having a breast X-ray (mammogram) every year.  If you have a family history of breast cancer, talk to your health care provider about genetic screening.  If you are at high risk for breast cancer, talk to your health care provider about having an MRI and a mammogram every year.  Breast cancer gene (BRCA) assessment is recommended for women who have family members with BRCA-related cancers.  BRCA-related cancers include:  Breast.  Ovarian.  Tubal.  Peritoneal cancers.  Results of the assessment will determine the need for genetic counseling and BRCA1 and BRCA2 testing. Cervical Cancer Your health care provider may recommend that you be screened regularly for cancer of the pelvic organs (ovaries, uterus, and vagina). This screening involves a pelvic examination, including checking for microscopic changes to the surface of your cervix (Pap test). You may be encouraged to have this screening done every 3 years, beginning at age 68.  For women ages 66-65, health care providers may recommend pelvic exams and Pap testing every 3 years, or they may recommend the Pap and pelvic exam, combined with testing for human papilloma virus (HPV), every 5 years. Some types of HPV increase your risk of cervical cancer. Testing for HPV may also be done on women of any age with unclear Pap test results.  Other health care providers may not recommend any screening for nonpregnant women who are considered low risk for pelvic cancer and who do not have symptoms. Ask your health care provider if a screening pelvic exam is right for you.  If you have had past treatment for cervical cancer or a condition that could lead to cancer, you need Pap tests and screening for cancer for at least 20 years after your treatment. If Pap tests have been discontinued, your risk factors (such as having a new sexual partner) need to be reassessed to determine if screening should resume. Some women have medical problems that increase the chance of getting cervical cancer. In these cases, your health care provider may recommend more frequent screening and Pap tests. Colorectal Cancer  This type of cancer can be detected and often prevented.  Routine colorectal cancer screening usually begins at 47 years of age and continues through 47 years of age.  Your health care provider may recommend screening at an earlier age if you  have risk factors for colon cancer.  Your health care provider may also recommend using home test kits to check for hidden blood in the stool.  A small camera at the end of a tube can be used to examine your colon directly (sigmoidoscopy or colonoscopy). This is done to check for the earliest forms of colorectal cancer.  Routine screening usually begins at age 72.  Direct examination of the colon should be repeated every 5-10 years through 47 years of age. However, you may need to be screened more often if early forms of precancerous polyps or small growths are found. Skin Cancer  Check your skin from head to toe regularly.  Tell your health care provider about any new moles or changes in moles, especially if there is a change in a mole's shape or color.  Also tell your health care provider if you have a mole that is larger than the size of a pencil eraser.  Always use sunscreen.  Apply sunscreen liberally and repeatedly throughout the day.  Protect yourself by wearing long sleeves, pants, a wide-brimmed hat, and sunglasses whenever you are outside. HEART DISEASE, DIABETES, AND HIGH BLOOD PRESSURE   High blood pressure causes heart disease and increases the risk of stroke. High blood pressure is more likely to develop in:  People who have blood pressure in the high end of the normal range (130-139/85-89 mm Hg).  People who are overweight or obese.  People who are African American.  If you are 96-41 years of age, have your blood pressure checked every 3-5 years. If you are 12 years of age or older, have your blood pressure checked every year. You should have your blood pressure measured twice--once when you are at a hospital or clinic, and once when you are not at a hospital or clinic. Record the average of the two measurements. To check your blood pressure when you are not at a hospital or clinic, you can use:  An automated blood pressure machine at a pharmacy.  A home blood pressure  monitor.  If you are between 1 years and 15 years old, ask your health care provider if you should take aspirin to prevent strokes.  Have regular diabetes screenings. This involves taking a blood sample to check your fasting blood sugar level.  If you are at a normal weight and have a low risk for diabetes, have this test once every three years after 47 years of age.  If you are overweight and have a high risk for diabetes, consider being tested at a younger age or more often. PREVENTING INFECTION  Hepatitis B  If you have a higher risk for hepatitis B, you should be screened for this virus. You are considered at high risk for hepatitis B if:  You were born in a country where hepatitis B is common. Ask your health care provider which countries are considered high risk.  Your parents were born in a high-risk country, and you have not been immunized against hepatitis B (hepatitis B vaccine).  You have HIV or AIDS.  You use needles to inject street drugs.  You live with someone who has hepatitis B.  You have had sex with someone who has hepatitis B.  You get hemodialysis treatment.  You take certain medicines for conditions, including cancer, organ transplantation, and autoimmune conditions. Hepatitis C  Blood testing is recommended for:  Everyone born from [birth year] through 1967.  Anyone with known risk factors for hepatitis C. Sexually transmitted infections (STIs)  You should be screened for sexually transmitted infections (STIs) including gonorrhea and chlamydia if:  You are sexually active and are younger than 47 years of age.  You are older than 47 years of age and your health care provider tells you that you are at risk for this type of infection.  Your sexual activity has changed since you were last screened and you are at an increased risk for chlamydia or gonorrhea. Ask your health care provider if you are at risk.  If you do not have HIV, but are at risk, it may be  recommended that you take a prescription medicine daily to prevent HIV infection. This is called pre-exposure prophylaxis (PrEP). You are considered at risk if:  You are sexually active and do not regularly use condoms or know the HIV status of your partner(s).  You take drugs by injection.  You are sexually active with a partner who has HIV. Talk with your health care provider about  whether you are at high risk of being infected with HIV. If you choose to begin PrEP, you should first be tested for HIV. You should then be tested every 3 months for as long as you are taking PrEP.  PREGNANCY   If you are premenopausal and you may become pregnant, ask your health care provider about preconception counseling.  If you may become pregnant, take 400 to 800 micrograms (mcg) of folic acid every day.  If you want to prevent pregnancy, talk to your health care provider about birth control (contraception). OSTEOPOROSIS AND MENOPAUSE   Osteoporosis is a disease in which the bones lose minerals and strength with aging. This can result in serious bone fractures. Your risk for osteoporosis can be identified using a bone density scan.  If you are 35 years of age or older, or if you are at risk for osteoporosis and fractures, ask your health care provider if you should be screened.  Ask your health care provider whether you should take a calcium or vitamin D supplement to lower your risk for osteoporosis.  Menopause may have certain physical symptoms and risks.  Hormone replacement therapy may reduce some of these symptoms and risks. Talk to your health care provider about whether hormone replacement therapy is right for you.  HOME CARE INSTRUCTIONS   Schedule regular health, dental, and eye exams.  Stay current with your immunizations.   Do not use any tobacco products including cigarettes, chewing tobacco, or electronic cigarettes.  If you are pregnant, do not drink alcohol.  If you are  breastfeeding, limit how much and how often you drink alcohol.  Limit alcohol intake to no more than 1 drink per day for nonpregnant women. One drink equals 12 ounces of beer, 5 ounces of wine, or 1 ounces of hard liquor.  Do not use street drugs.  Do not share needles.  Ask your health care provider for help if you need support or information about quitting drugs.  Tell your health care provider if you often feel depressed.  Tell your health care provider if you have ever been abused or do not feel safe at home.   This information is not intended to replace advice given to you by your health care provider. Make sure you discuss any questions you have with your health care provider.   Document Released: 12/13/2010 Document Revised: 06/20/2014 Document Reviewed: 05/01/2013 Elsevier Interactive Patient Education [employer].

## 2015-08-06 NOTE — Progress Notes (Signed)
   Subjective:    Patient ID: [name], female    DOB: 1970/10/06, 45 y.o.   MRN: [id number]  HPI The patient is a new 45 YO female coming in for allergies. She does get severe allergies during the spring and wants to know what she can do about it. No fevers or chills right now. No headaches or pressure. Not taking any allergy medicine. Takes tylenol sinus during flares which is not working well. Had steroids back in January which helped significantly. She wants to know if she can have more today.   PMH, [facility], social history reviewed and updated.   Review of Systems  Constitutional: Negative for fever, chills, activity change, appetite change, fatigue and unexpected weight change.  HENT: Positive for congestion and postnasal drip. Negative for ear discharge, ear pain, rhinorrhea, sinus pressure, sore throat, tinnitus and voice change.   Respiratory: Positive for cough. Negative for chest tightness, shortness of breath and wheezing.        Mild from drainage  Cardiovascular: Negative for chest pain, palpitations and leg swelling.  Gastrointestinal: Negative for nausea, abdominal pain, diarrhea, constipation and abdominal distention.  Musculoskeletal: Negative.   Skin: Negative.   Neurological: Negative for dizziness, weakness, light-headedness, numbness and headaches.  Psychiatric/Behavioral: Negative.       Objective:   Physical Exam  Constitutional: She is oriented to person, place, and time. She appears well-developed and well-nourished.  HENT:  Head: Normocephalic and atraumatic.  Oropharynx with mild erythema, no CAD, no sinus tenderness, clear drainage  Eyes: EOM are normal.  Neck: Normal range of motion.  Cardiovascular: Normal rate and regular rhythm.   Pulmonary/Chest: Effort normal and breath sounds normal. No respiratory distress. She has no wheezes. She has no rales.  Abdominal: Soft. Bowel sounds are normal. She exhibits no distension. There is no tenderness.  There is no rebound.  Musculoskeletal: She exhibits no edema.  Neurological: She is alert and oriented to person, place, and time. Coordination normal.  Skin: Skin is warm and dry.  Psychiatric: She has a normal mood and affect.   Filed Vitals:   08/06/15 0857 08/06/15 0930  BP: 162/120 128/92  Pulse: 89   Temp: 98.6 F (37 C)   TempSrc: Oral   Resp: 16   Height:  (1.676 m)   Weight: 203 lb 1.9 oz (92.135 kg)   SpO2: 98%       Assessment & Plan:

## 2015-08-06 NOTE — Assessment & Plan Note (Signed)
Rx for flonase today for her allergies. Does not need steroids or steroid injection today and talked to her about the risks and harms from those long term on weight and bone health.

## 2015-08-06 NOTE — Assessment & Plan Note (Signed)
Will bring her back in 6 months for BP recheck. Initial high today but recheck very close to normal. She does have strong family history of hypertension and advised her that she will likely have problems with it at some time. She will work on weight loss in the meantime to help.

## 2015-09-07 ENCOUNTER — Telehealth: Payer: Self-pay | Admitting: Internal Medicine

## 2015-09-07 NOTE — Telephone Encounter (Signed)
Patient states the last time she was in to see Dr. Okey Duprerawford, Dr. Okey Duprerawford states if patient started to have issues with sinuses to let her know and she could call something in.  Patient is currently having a running nose with facial pressure,and congestion.  Patient was wanting to know if something could be sent over to [facility] on [address].

## 2015-09-07 NOTE — Telephone Encounter (Signed)
Is she using the flonase?

## 2015-09-08 NOTE — Telephone Encounter (Signed)
Left message for patient to call back. Need to ask her if she has been using the flonase.

## 2015-12-14 ENCOUNTER — Other Ambulatory Visit: Payer: Self-pay | Admitting: *Deleted

## 2015-12-14 MED ORDER — FLUTICASONE PROPIONATE 50 MCG/ACT NA SUSP: 2.0000 | Freq: Every day | NASAL | Status: DC

## 2016-01-19 ENCOUNTER — Ambulatory Visit: Payer: BC Managed Care – PPO | Admitting: Internal Medicine

## 2016-01-27 ENCOUNTER — Encounter: Payer: Self-pay | Admitting: Internal Medicine

## 2016-01-27 ENCOUNTER — Ambulatory Visit (INDEPENDENT_AMBULATORY_CARE_PROVIDER_SITE_OTHER): Payer: BC Managed Care – PPO | Admitting: Internal Medicine

## 2016-01-27 VITALS — BP 140/90 | HR 89 | Temp 98.4°F | Resp 14 | Ht 66.0 in | Wt 217.0 lb

## 2016-01-27 DIAGNOSIS — Z0000 Encounter for general adult medical examination without abnormal findings: Secondary | ICD-10-CM | POA: Diagnosis not present

## 2016-01-27 DIAGNOSIS — E669 Obesity, unspecified: Secondary | ICD-10-CM | POA: Diagnosis not present

## 2016-01-27 DIAGNOSIS — R030 Elevated blood-pressure reading, without diagnosis of hypertension: Secondary | ICD-10-CM

## 2016-01-27 NOTE — Progress Notes (Signed)
   Subjective:    Patient ID: Mckenzie PaciniMichelle A Holmes, female    DOB: September 14, 1970, 45 y.o.   MRN: [id number]  HPI The patient is a 45 YO female coming in for wellness.   [facility], social history reviewed and updated.   Review of Systems  Constitutional: Negative for activity change, appetite change, chills, fatigue, fever and unexpected weight change.  HENT: Negative for congestion, ear discharge, ear pain, postnasal drip, rhinorrhea, sinus pressure, sore throat, tinnitus and voice change.   Respiratory: Negative for cough, chest tightness, shortness of breath and wheezing.   Cardiovascular: Negative for chest pain, palpitations and leg swelling.  Gastrointestinal: Negative for abdominal distention, abdominal pain, constipation, diarrhea and nausea.  Musculoskeletal: Negative.   Skin: Negative.   Neurological: Negative for dizziness, weakness, light-headedness, numbness and headaches.  Psychiatric/Behavioral: Negative.       Objective:   Physical Exam  Constitutional: She is oriented to person, place, and time. She appears well-developed and well-nourished.  HENT:  Head: Normocephalic and atraumatic.  Right Ear: External ear normal.  Left Ear: External ear normal.  Mouth/Throat: Oropharynx is clear and moist.  Eyes: EOM are normal.  Glasses  Neck: Normal range of motion.  Cardiovascular: Normal rate and regular rhythm.   Pulmonary/Chest: Effort normal and breath sounds normal. No respiratory distress. She has no wheezes. She has no rales.  Abdominal: Soft. Bowel sounds are normal. She exhibits no distension. There is no tenderness. There is no rebound.  Musculoskeletal: She exhibits no edema.  Neurological: She is alert and oriented to person, place, and time. Coordination normal.  Skin: Skin is warm and dry.  Psychiatric: She has a normal mood and affect.   Vitals:   01/27/16 0942 01/27/16 1021  BP: (!) 160/102 140/90  Pulse: 89   Resp: 14   Temp: 98.4 F (36.9 C)   TempSrc:  Oral   SpO2: 98%   Weight: 217 lb (98.4 kg)   Height: 5\' 6"  (1.676 m)       Assessment & Plan:

## 2016-01-27 NOTE — Assessment & Plan Note (Signed)
BP again initially elevated but recheck is normal. She states monitoring at home is normal. Strong family history and will continue to monitor.

## 2016-01-27 NOTE — Progress Notes (Signed)
Pre visit review using our clinic review tool, if applicable. No additional management support is needed unless otherwise documented below in the visit note. 

## 2016-01-27 NOTE — Assessment & Plan Note (Signed)
Labs reviewed with the patient from last visit. Referral to nutritionist for her weight loss goals. She is exercising and non-smoker. Mammogram and pap smear up to date. Given screening recommendations.

## 2016-01-27 NOTE — Patient Instructions (Signed)
We will get you in with the nutritionist to talk to them about diet and foods. My recommendation is to work on counting calories. The recommended goal for weight loss is about 1700 calories per day.   Exercising is great and helps with weight loss and the recommendation for this is about 45 minutes per time and 5-6 days per week.   Health Maintenance, Female Adopting a healthy lifestyle and getting preventive care can go a long way to promote health and wellness. Talk with your health care provider about what schedule of regular examinations is right for you. This is a good chance for you to check in with your provider about disease prevention and staying healthy. In between checkups, there are plenty of things you can do on your own. Experts have done a lot of research about which lifestyle changes and preventive measures are most likely to keep you healthy. Ask your health care provider for more information. WEIGHT AND DIET  Eat a healthy diet  Be sure to include plenty of vegetables, fruits, low-fat dairy products, and lean protein.  Do not eat a lot of foods high in solid fats, added sugars, or salt.  Get regular exercise. This is one of the most important things you can do for your health.  Most adults should exercise for at least 150 minutes each week. The exercise should increase your heart rate and make you sweat (moderate-intensity exercise).  Most adults should also do strengthening exercises at least twice a week. This is in addition to the moderate-intensity exercise.  Maintain a healthy weight  Body mass index (BMI) is a measurement that can be used to identify possible weight problems. It estimates body fat based on height and weight. Your health care provider can help determine your BMI and help you achieve or maintain a healthy weight.  For females 85 years of age and older:   A BMI below 18.5 is considered underweight.  A BMI of 18.5 to 24.9 is normal.  A BMI of 25 to  29.9 is considered overweight.  A BMI of 30 and above is considered obese.  Watch levels of cholesterol and blood lipids  You should start having your blood tested for lipids and cholesterol at 47 years of age, then have this test every 5 years.  You may need to have your cholesterol levels checked more often if:  Your lipid or cholesterol levels are high.  You are older than 47 years of age.  You are at high risk for heart disease.  CANCER SCREENING   Lung Cancer  Lung cancer screening is recommended for adults 2-90 years old who are at high risk for lung cancer because of a history of smoking.  A yearly low-dose CT scan of the lungs is recommended for people who:  Currently smoke.  Have quit within the past 15 years.  Have at least a 30-pack-year history of smoking. A pack year is smoking an average of one pack of cigarettes a day for 1 year.  Yearly screening should continue until it has been 15 years since you quit.  Yearly screening should stop if you develop a health problem that would prevent you from having lung cancer treatment.  Breast Cancer  Practice breast self-awareness. This means understanding how your breasts normally appear and feel.  It also means doing regular breast self-exams. Let your health care provider know about any changes, no matter how small.  If you are in your 20s or 30s, you should  have a clinical breast exam (CBE) by a health care provider every 1-3 years as part of a regular health exam.  If you are 37 or older, have a CBE every year. Also consider having a breast X-ray (mammogram) every year.  If you have a family history of breast cancer, talk to your health care provider about genetic screening.  If you are at high risk for breast cancer, talk to your health care provider about having an MRI and a mammogram every year.  Breast cancer gene (BRCA) assessment is recommended for women who have family members with BRCA-related  cancers. BRCA-related cancers include:  Breast.  Ovarian.  Tubal.  Peritoneal cancers.  Results of the assessment will determine the need for genetic counseling and BRCA1 and BRCA2 testing. Cervical Cancer Your health care provider may recommend that you be screened regularly for cancer of the pelvic organs (ovaries, uterus, and vagina). This screening involves a pelvic examination, including checking for microscopic changes to the surface of your cervix (Pap test). You may be encouraged to have this screening done every 3 years, beginning at age 40.  For women ages 25-65, health care providers may recommend pelvic exams and Pap testing every 3 years, or they may recommend the Pap and pelvic exam, combined with testing for human papilloma virus (HPV), every 5 years. Some types of HPV increase your risk of cervical cancer. Testing for HPV may also be done on women of any age with unclear Pap test results.  Other health care providers may not recommend any screening for nonpregnant women who are considered low risk for pelvic cancer and who do not have symptoms. Ask your health care provider if a screening pelvic exam is right for you.  If you have had past treatment for cervical cancer or a condition that could lead to cancer, you need Pap tests and screening for cancer for at least 20 years after your treatment. If Pap tests have been discontinued, your risk factors (such as having a new sexual partner) need to be reassessed to determine if screening should resume. Some women have medical problems that increase the chance of getting cervical cancer. In these cases, your health care provider may recommend more frequent screening and Pap tests. Colorectal Cancer  This type of cancer can be detected and often prevented.  Routine colorectal cancer screening usually begins at 47 years of age and continues through 47 years of age.  Your health care provider may recommend screening at an earlier  age if you have risk factors for colon cancer.  Your health care provider may also recommend using home test kits to check for hidden blood in the stool.  A small camera at the end of a tube can be used to examine your colon directly (sigmoidoscopy or colonoscopy). This is done to check for the earliest forms of colorectal cancer.  Routine screening usually begins at age 35.  Direct examination of the colon should be repeated every 5-10 years through 47 years of age. However, you may need to be screened more often if early forms of precancerous polyps or small growths are found. Skin Cancer  Check your skin from head to toe regularly.  Tell your health care provider about any new moles or changes in moles, especially if there is a change in a mole's shape or color.  Also tell your health care provider if you have a mole that is larger than the size of a pencil eraser.  Always use sunscreen. Apply  sunscreen liberally and repeatedly throughout the day.  Protect yourself by wearing long sleeves, pants, a wide-brimmed hat, and sunglasses whenever you are outside. HEART DISEASE, DIABETES, AND HIGH BLOOD PRESSURE   High blood pressure causes heart disease and increases the risk of stroke. High blood pressure is more likely to develop in:  People who have blood pressure in the high end of the normal range (130-139/85-89 mm Hg).  People who are overweight or obese.  People who are African American.  If you are 67-36 years of age, have your blood pressure checked every 3-5 years. If you are 55 years of age or older, have your blood pressure checked every year. You should have your blood pressure measured twice--once when you are at a hospital or clinic, and once when you are not at a hospital or clinic. Record the average of the two measurements. To check your blood pressure when you are not at a hospital or clinic, you can use:  An automated blood pressure machine at a pharmacy.  A home  blood pressure monitor.  If you are between 10 years and 81 years old, ask your health care provider if you should take aspirin to prevent strokes.  Have regular diabetes screenings. This involves taking a blood sample to check your fasting blood sugar level.  If you are at a normal weight and have a low risk for diabetes, have this test once every three years after 47 years of age.  If you are overweight and have a high risk for diabetes, consider being tested at a younger age or more often. PREVENTING INFECTION  Hepatitis B  If you have a higher risk for hepatitis B, you should be screened for this virus. You are considered at high risk for hepatitis B if:  You were born in a country where hepatitis B is common. Ask your health care provider which countries are considered high risk.  Your parents were born in a high-risk country, and you have not been immunized against hepatitis B (hepatitis B vaccine).  You have HIV or AIDS.  You use needles to inject street drugs.  You live with someone who has hepatitis B.  You have had sex with someone who has hepatitis B.  You get hemodialysis treatment.  You take certain medicines for conditions, including cancer, organ transplantation, and autoimmune conditions. Hepatitis C  Blood testing is recommended for:  Everyone born from [birth year] through 1967.  Anyone with known risk factors for hepatitis C. Sexually transmitted infections (STIs)  You should be screened for sexually transmitted infections (STIs) including gonorrhea and chlamydia if:  You are sexually active and are younger than 47 years of age.  You are older than 47 years of age and your health care provider tells you that you are at risk for this type of infection.  Your sexual activity has changed since you were last screened and you are at an increased risk for chlamydia or gonorrhea. Ask your health care provider if you are at risk.  If you do not have HIV, but are at  risk, it may be recommended that you take a prescription medicine daily to prevent HIV infection. This is called pre-exposure prophylaxis (PrEP). You are considered at risk if:  You are sexually active and do not regularly use condoms or know the HIV status of your partner(s).  You take drugs by injection.  You are sexually active with a partner who has HIV. Talk with your health care provider about whether  you are at high risk of being infected with HIV. If you choose to begin PrEP, you should first be tested for HIV. You should then be tested every 3 months for as long as you are taking PrEP.  PREGNANCY   If you are premenopausal and you may become pregnant, ask your health care provider about preconception counseling.  If you may become pregnant, take 400 to 800 micrograms (mcg) of folic acid every day.  If you want to prevent pregnancy, talk to your health care provider about birth control (contraception). OSTEOPOROSIS AND MENOPAUSE   Osteoporosis is a disease in which the bones lose minerals and strength with aging. This can result in serious bone fractures. Your risk for osteoporosis can be identified using a bone density scan.  If you are 8 years of age or older, or if you are at risk for osteoporosis and fractures, ask your health care provider if you should be screened.  Ask your health care provider whether you should take a calcium or vitamin D supplement to lower your risk for osteoporosis.  Menopause may have certain physical symptoms and risks.  Hormone replacement therapy may reduce some of these symptoms and risks. Talk to your health care provider about whether hormone replacement therapy is right for you.  HOME CARE INSTRUCTIONS   Schedule regular health, dental, and eye exams.  Stay current with your immunizations.   Do not use any tobacco products including cigarettes, chewing tobacco, or electronic cigarettes.  If you are pregnant, do not drink alcohol.  If  you are breastfeeding, limit how much and how often you drink alcohol.  Limit alcohol intake to no more than 1 drink per day for nonpregnant women. One drink equals 12 ounces of beer, 5 ounces of wine, or 1 ounces of hard liquor.  Do not use street drugs.  Do not share needles.  Ask your health care provider for help if you need support or information about quitting drugs.  Tell your health care provider if you often feel depressed.  Tell your health care provider if you have ever been abused or do not feel safe at home.   This information is not intended to replace advice given to you by your health care provider. Make sure you discuss any questions you have with your health care provider.   Document Released: 12/13/2010 Document Revised: 06/20/2014 Document Reviewed: 05/01/2013 Elsevier Interactive Patient Education [employer].

## 2016-03-16 ENCOUNTER — Encounter: Payer: Self-pay | Admitting: Skilled Nursing Facility1

## 2016-03-16 ENCOUNTER — Encounter: Payer: BC Managed Care – PPO | Attending: Internal Medicine | Admitting: Skilled Nursing Facility1

## 2016-03-16 DIAGNOSIS — E669 Obesity, unspecified: Secondary | ICD-10-CM | POA: Insufficient documentation

## 2016-03-16 DIAGNOSIS — E6609 Other obesity due to excess calories: Secondary | ICD-10-CM

## 2016-03-16 NOTE — Progress Notes (Signed)
  Medical Nutrition Therapy:  Appt start time: 3:24 end time:  4:00   Assessment:  Primary concerns today: referred for obesity. Pt states her physician told her she needs to lose 30 pounds in 6 months. Pt states her usual wt as an adult is about 195 pounds. Pt states she is back in the gym.  Pt states her sleep is good.Pt was not talkative and was hard on herself for her food choices and wt gain.  Preferred Learning Style:   No preference indicated   Learning Readiness:   Contemplating  MEDICATIONS: See List   DIETARY INTAKE:  Usual eating pattern includes 3 meals and 2 snacks per day.  Everyday foods include none stated.  Avoided foods include pork.    24-hr recall:  B ( AM): bagel and cream cheese-----muffins Snk ( AM):  L ( PM): frozen meals Snk ( PM): yogurt D ( PM): [place] burger and chips and yogurt-----baked chicken and spagetti Snk ( PM): fruit Beverages: water, flavors for water, sweet tea *meals outside the home 3-4 Usual physical activity: 3 days a week for 30-45 minutes   Progress Towards Goal(s):  In progress.   Nutritional Diagnosis:  NB-1.1 Food and nutrition-related knowledge deficit As related to no prior nutrition information from a nutrition professional.  As evidenced by pt report, 24 hr recall.    Intervention:  Nutrition counseling for overweight. Dietitian educated the pt on behavior change and health for her body. Goals:  Consistency and Awareness=Key Terms   Phase 1: Exercise   -Every week 3 days a week for 30 minutes  Time frame is dependant on you and what you are ready for  Phase 2: Food  --Eat 3 meals a day and snacks in between (if you are hungry for the snacks) -A meal: carbohydrate, protein, vegetable -A snack: A Fruit OR Vegetable AND Protein -Honor your body by listening to your hunger and fullness cues -First thought: Am I hungry? -Second thought: (if the answer is yes I am hungry) Does this meal have vegetables? Protein?  Carbohydrate?  -Third thought: Are there more vegetables on my plate compared to Protein and Carbohydrates? -After you have finished your first serving Do Not go back for more until you have waited 20-30 minutes and checked in with your body by asking Am I Still Hungry?   Phase 3: Soda  -Try to get a smaller soda  Teaching Method Utilized:  Visual Auditory Hands on  Barriers to learning/adherence to lifestyle change: none identified  Demonstrated degree of understanding via:  Teach Back   Monitoring/Evaluation:  Dietary intake, exercise, and body weight prn.

## 2016-03-16 NOTE — Patient Instructions (Signed)
  Consistency and Awareness=Key Terms   Phase 1: Exercise   -Every week 3 days a week for 30 minutes  Time frame is dependant on you and what you are ready for  Phase 2: Food  --Eat 3 meals a day and snacks in between (if you are hungry for the snacks) -A meal: carbohydrate, protein, vegetable -A snack: A Fruit OR Vegetable AND Protein -Honor your body by listening to your hunger and fullness cues -First thought: Am I hungry? -Second thought: (if the answer is yes I am hungry) Does this meal have vegetables? Protein? Carbohydrate?  -Third thought: Are there more vegetables on my plate compared to Protein and Carbohydrates? -After you have finished your first serving Do Not go back for more until you have waited 20-30 minutes and checked in with your body by asking Am I Still Hungry?   Phase 3: Soda  -Try to get a smaller soda

## 2016-06-12 ENCOUNTER — Emergency Department (HOSPITAL_COMMUNITY)
Admission: EM | Admit: 2016-06-12 | Discharge: 2016-06-12 | Disposition: A | Discharge status: Home / Self Care | Payer: BC Managed Care – PPO | Attending: Emergency Medicine | Admitting: Emergency Medicine

## 2016-06-12 ENCOUNTER — Emergency Department (HOSPITAL_COMMUNITY): Payer: BC Managed Care – PPO

## 2016-06-12 DIAGNOSIS — Z79899 Other long term (current) drug therapy: Secondary | ICD-10-CM | POA: Insufficient documentation

## 2016-06-12 DIAGNOSIS — M5441 Lumbago with sciatica, right side: Secondary | ICD-10-CM | POA: Insufficient documentation

## 2016-06-12 DIAGNOSIS — M5431 Sciatica, right side: Secondary | ICD-10-CM

## 2016-06-12 DIAGNOSIS — M545 Low back pain: Secondary | ICD-10-CM | POA: Diagnosis present

## 2016-06-12 LAB — POC URINE PREG, ED: Preg Test, Ur: NEGATIVE

## 2016-06-12 MED ORDER — PREDNISONE 50 MG PO TABS: 50.0000 mg | ORAL_TABLET | Freq: Every day | ORAL | 0 refills | Status: DC

## 2016-06-12 MED ORDER — KETOROLAC TROMETHAMINE 60 MG/2ML IM SOLN
60.0000 mg | Freq: Once | INTRAMUSCULAR | Status: AC
  Administered 2016-06-12: 60 mg via INTRAMUSCULAR
  Filled 2016-06-12: qty 2

## 2016-06-12 MED ORDER — HYDROCODONE-ACETAMINOPHEN 5-325 MG PO TABS
1.0000 | ORAL_TABLET | Freq: Once | ORAL | Status: DC
  Filled 2016-06-12: qty 1

## 2016-06-12 MED ORDER — HYDROCODONE-ACETAMINOPHEN 5-325 MG PO TABS: 1.0000 | ORAL_TABLET | Freq: Four times a day (QID) | ORAL | 0 refills | Status: DC | PRN

## 2016-06-12 MED ORDER — CYCLOBENZAPRINE HCL 10 MG PO TABS: 10.0000 mg | ORAL_TABLET | Freq: Every day | ORAL | 0 refills | Status: DC

## 2016-06-12 NOTE — ED Notes (Signed)
Patient transported to X-ray 

## 2016-06-12 NOTE — ED Triage Notes (Signed)
Pt states that she has had R lower back pain radiating down to her hip and leg x 1 week. Alert and oriented.

## 2016-06-12 NOTE — ED Provider Notes (Signed)
[facility]-EMERGENCY DEPT Provider Note   CSN: [id number] Arrival date & time: 06/12/16  2012     History   Chief Complaint Chief Complaint  Patient presents with  . Hip Pain    HPI [name] is a 45 y.o. female.  HPI Patient presents to the emergency department with right lower back pain that radiates into her right leg and stops at the knee.  Patient states this started one week ago when she attempted to use ibuprofen without significant relief of her symptoms.  The patient states that she has had previous back related issues, but never had any lower back surgeries.  Patient states that movement and palpation make the pain worse.  She states that nothing seems to alleviate the symptoms. The patient denies chest pain, shortness of breath, headache,blurred vision, neck pain, fever, cough, weakness, numbness, dizziness, anorexia, edema, abdominal pain, nausea, vomiting, diarrhea, rash, back pain, dysuria, hematemesis, bloody stool, near syncope, or syncope. Past Medical History:  Diagnosis Date  . Anxiety     Patient Active Problem List   Diagnosis Date Noted  . Routine general medical examination at a health care facility 01/27/2016  . Elevated blood pressure (not hypertension) 08/06/2015  . Allergic rhinitis 08/06/2015  . Obesity 08/06/2015    Past Surgical History:  Procedure Laterality Date  . OVARIAN CYST REMOVAL      OB History    No data available       Home Medications    Prior to Admission medications   Medication Sig Start Date End Date Taking? Authorizing Provider  escitalopram (LEXAPRO) 10 MG tablet Take 10 mg by mouth daily.    Historical Provider, MD  fluticasone (FLONASE) 50 MCG/ACT nasal spray Place 2 sprays into both nostrils daily. 12/14/15   Myrlene BrokerElizabeth A Crawford, MD  Multiple Vitamin (MULITIVITAMIN WITH MINERALS) TABS Take 1 tablet by mouth daily.      Historical Provider, MD    Family History Family History  Problem Relation Age of Onset    . Stroke Mother   . Hypertension Mother   . Hyperlipidemia Mother   . Hypertension Father   . Hyperlipidemia Father   . Cancer Father     colorectal  . Diabetes Paternal Uncle   . Diabetes Paternal Grandmother     Social History Social History  Substance Use Topics  . Smoking status: Never Smoker  . Smokeless tobacco: Never Used  . Alcohol use Yes     Allergies   Tomato   Review of Systems Review of Systems All other systems negative except as documented in the HPI. All pertinent positives and negatives as reviewed in the HPI.  Physical Exam Updated Vital Signs BP 161/98 (BP Location: Right Arm)   Pulse 88   Temp 97.9 F (36.6 C) (Oral)   Resp 18   SpO2 100%   Physical Exam  Constitutional: She is oriented to person, place, and time. She appears well-developed and well-nourished. No distress.  HENT:  Head: Normocephalic and atraumatic.  Eyes: Pupils are equal, round, and reactive to light.  Pulmonary/Chest: Effort normal.  Musculoskeletal:       Lumbar back: She exhibits tenderness and pain. She exhibits normal range of motion, no bony tenderness and no deformity.       Back:  Neurological: She is alert and oriented to person, place, and time. She has normal strength. She displays normal reflexes. No sensory deficit. She exhibits normal muscle tone. Coordination and gait normal.  Skin: Skin is warm  and dry.  Psychiatric: She has a normal mood and affect.  Nursing note and vitals reviewed.    ED Treatments / Results  Labs (all labs ordered are listed, but only abnormal results are displayed) Labs Reviewed  POC URINE PREG, ED    EKG  EKG Interpretation None       Radiology Dg Lumbar Spine Complete  Result Date: 06/12/2016 CLINICAL DATA:  Right lower back pain radiating into her right lower extremity for 1 week EXAM: LUMBAR SPINE - COMPLETE 4+ VIEW COMPARISON:  None. FINDINGS: There is no evidence of lumbar spine fracture. Mild left convex  curvature. Mild lumbar facet arthritis, greatest on the right at L4-5. Intervertebral disc spaces are maintained. Probable right nephrolithiasis.  No conclusive ureteral calculus. IUD noted. IMPRESSION: 1. Slight left convex curvature.  Mild lower lumbar facet arthritis. 2. Probable right nephrolithiasis without conclusive ureteral calculus. Electronically Signed   By: Ellery Plunkaniel R Mitchell M.D.   On: 06/12/2016 22:27    Procedures Procedures (including critical care time)  Medications Ordered in ED Medications  HYDROcodone-acetaminophen (NORCO/VICODIN) 5-325 MG per tablet 1 tablet (0 tablets Oral Hold 06/12/16 2115)  ketorolac (TORADOL) injection 60 mg (60 mg Intramuscular Given 06/12/16 2115)     Initial Impression / Assessment and Plan / ED Course  I have reviewed the triage vital signs and the nursing notes.  Pertinent labs & imaging results that were available during my care of the patient were reviewed by me and considered in my medical decision making (see chart for details).  Clinical Course     Patient be treated for sciatica.  Told to follow up with her primary care Dr. told to return here as needed.  Did advise her that this could require further testing and imaging, which can be done as an outpatient.  The patient agrees the plan and all questions were answered.  She was ambulated, has no gait disturbance.  She has normal strength in her lower extremities, normal reflexes  Final Clinical Impressions(s) / ED Diagnoses   Final diagnoses:  None    New Prescriptions New Prescriptions   No medications on file     Charlestine NightChristopher Evon Dejarnett, PA-C 06/12/16 2236    Charlynne Panderavid Hsienta Yao, MD 06/13/16 [phone]

## 2016-06-12 NOTE — Discharge Instructions (Signed)
Your x-ray showed some arthritis in her lower back.  No other significant issues noted.  Return here as needed.  Follow-up with her primary care doctor for reevaluation

## 2016-08-02 ENCOUNTER — Ambulatory Visit (INDEPENDENT_AMBULATORY_CARE_PROVIDER_SITE_OTHER): Payer: BC Managed Care – PPO | Admitting: Internal Medicine

## 2016-08-02 ENCOUNTER — Encounter: Payer: Self-pay | Admitting: Internal Medicine

## 2016-08-02 DIAGNOSIS — R030 Elevated blood-pressure reading, without diagnosis of hypertension: Secondary | ICD-10-CM | POA: Diagnosis not present

## 2016-08-02 NOTE — Patient Instructions (Signed)
We will have you check the blood pressure at the grocery store a couple times a month to keep track.  If the top number is around or above 150 most of the time call the office or send us a message.  If the bottom number if above 90 most of the time call the office or send us a message.

## 2016-08-02 NOTE — Progress Notes (Signed)
Pre visit review using our clinic review tool, if applicable. No additional management support is needed unless otherwise documented below in the visit note. 

## 2016-08-02 NOTE — Assessment & Plan Note (Signed)
BP is elevated severely today with recent prednisone course and injection as well as increased stress. She has not been checking at home and have asked her to check this again as she has strong family history of HTN. If still elevated at home needs medication for her blood pressure.

## 2016-08-02 NOTE — Progress Notes (Signed)
   Subjective:    Patient ID: Mckenzie PaciniMichelle A Wales, female    DOB: [birth year], 46 y.o.   MRN: [id number]  HPI The patient is a 46 YO female coming in for follow up of her weight (down about 4 pounds since last visit, not keeping up with her diet due to recent back injury with prednisone course and injection in her back, not exercising) and her blood pressure (has been elevated some in the office, she keeps track at home but meter broke and she is not able to check for months, strong family history of high blood pressure). Denies headaches, migraines, chest pains, SOB.   Review of Systems  Constitutional: Positive for activity change. Negative for appetite change, chills, fatigue, fever and unexpected weight change.  Respiratory: Negative.   Cardiovascular: Negative.   Gastrointestinal: Negative.   Musculoskeletal: Negative.   Neurological: Negative.       Objective:   Physical Exam  Constitutional: She is oriented to person, place, and time. She appears well-developed and well-nourished.  HENT:  Head: Normocephalic and atraumatic.  Right Ear: External ear normal.  Left Ear: External ear normal.  Eyes: EOM are normal.  Neck: Normal range of motion.  Cardiovascular: Normal rate and regular rhythm.   No murmur heard. Pulmonary/Chest: Effort normal and breath sounds normal. No respiratory distress. She has no wheezes. She has no rales.  Abdominal: Soft. Bowel sounds are normal. She exhibits no distension. There is no tenderness. There is no rebound.  Musculoskeletal: She exhibits no edema.  Neurological: She is alert and oriented to person, place, and time.  Skin: Skin is warm and dry.   Vitals:   08/02/16 0803 08/02/16 0821  BP: (!) 180/110 (!) 170/104  Pulse: (!) 106   Temp: 98.5 F (36.9 C)   TempSrc: Oral   SpO2: 99%   Weight: 213 lb (96.6 kg)   Height: 5\' 6"  (1.676 m)       Assessment & Plan:

## 2016-09-13 LAB — HM PAP SMEAR

## 2016-11-28 ENCOUNTER — Other Ambulatory Visit: Payer: Self-pay | Admitting: Internal Medicine

## 2016-11-28 NOTE — Telephone Encounter (Signed)
Routing to greg, please advise, thanks 

## 2017-02-14 ENCOUNTER — Encounter: Payer: Self-pay | Admitting: Internal Medicine

## 2017-03-08 ENCOUNTER — Encounter: Payer: Self-pay | Admitting: Internal Medicine

## 2017-03-17 ENCOUNTER — Encounter: Payer: Self-pay | Admitting: Internal Medicine

## 2017-03-17 ENCOUNTER — Other Ambulatory Visit (INDEPENDENT_AMBULATORY_CARE_PROVIDER_SITE_OTHER): Payer: Self-pay

## 2017-03-17 ENCOUNTER — Ambulatory Visit (INDEPENDENT_AMBULATORY_CARE_PROVIDER_SITE_OTHER): Payer: BC Managed Care – PPO | Admitting: Internal Medicine

## 2017-03-17 VITALS — BP 140/90 | HR 86 | Temp 97.9°F | Ht 66.0 in | Wt 221.0 lb

## 2017-03-17 DIAGNOSIS — R030 Elevated blood-pressure reading, without diagnosis of hypertension: Secondary | ICD-10-CM

## 2017-03-17 DIAGNOSIS — E6609 Other obesity due to excess calories: Secondary | ICD-10-CM

## 2017-03-17 DIAGNOSIS — Z0001 Encounter for general adult medical examination with abnormal findings: Secondary | ICD-10-CM | POA: Diagnosis not present

## 2017-03-17 DIAGNOSIS — Z0000 Encounter for general adult medical examination without abnormal findings: Secondary | ICD-10-CM

## 2017-03-17 LAB — LIPID PANEL
Cholesterol: 171 mg/dL (ref 0–200)
HDL: 42.3 mg/dL (ref >39.00)
LDL Cholesterol: 112 mg/dL — ABNORMAL HIGH (ref 0–99)
NonHDL: 128.46
Total CHOL/HDL Ratio: 4
Triglycerides: 84 mg/dL (ref 0.0–149.0)
VLDL: 16.8 mg/dL (ref 0.0–40.0)

## 2017-03-17 LAB — COMPREHENSIVE METABOLIC PANEL
ALT: 10 U/L (ref 0–35)
AST: 10 U/L (ref 0–37)
Albumin: 3.8 g/dL (ref 3.5–5.2)
Alkaline Phosphatase: 48 U/L (ref 39–117)
BUN: 13 mg/dL (ref 6–23)
CO2: 27 mEq/L (ref 19–32)
Calcium: 9 mg/dL (ref 8.4–10.5)
Chloride: 102 mEq/L (ref 96–112)
Creatinine, Ser: 0.99 mg/dL (ref 0.40–1.20)
GFR: 76.92 mL/min (ref >60.00)
Glucose, Bld: 93 mg/dL (ref 70–99)
Potassium: 3.6 mEq/L (ref 3.5–5.1)
Sodium: 136 mEq/L (ref 135–145)
Total Bilirubin: 0.6 mg/dL (ref 0.2–1.2)
Total Protein: 7.2 g/dL (ref 6.0–8.3)

## 2017-03-17 LAB — CBC
HCT: 38.3 % (ref 36.0–46.0)
Hemoglobin: 12.8 g/dL (ref 12.0–15.0)
MCHC: 33.5 g/dL (ref 30.0–36.0)
MCV: 92.5 fl (ref 78.0–100.0)
Platelets: 378 10*3/uL (ref 150.0–400.0)
RBC: 4.14 Mil/uL (ref 3.87–5.11)
RDW: 12.5 % (ref 11.5–15.5)
WBC: 7.2 10*3/uL (ref 4.0–10.5)

## 2017-03-17 LAB — HEMOGLOBIN A1C: Hgb A1c MFr Bld: 4.7 % (ref 4.6–6.5)

## 2017-03-17 MED ORDER — PHENTERMINE HCL 37.5 MG PO CAPS: 37.5000 mg | ORAL_CAPSULE | ORAL | 3 refills | Status: DC

## 2017-03-17 NOTE — Patient Instructions (Signed)
We have given you the prescription for the weight loss. Come back in about 3 months for a check in on the weight.   Health Maintenance, Female Adopting a healthy lifestyle and getting preventive care can go a long way to promote health and wellness. Talk with your health care provider about what schedule of regular examinations is right for you. This is a good chance for you to check in with your provider about disease prevention and staying healthy. In between checkups, there are plenty of things you can do on your own. Experts have done a lot of research about which lifestyle changes and preventive measures are most likely to keep you healthy. Ask your health care provider for more information. Weight and diet Eat a healthy diet  Be sure to include plenty of vegetables, fruits, low-fat dairy products, and lean protein.  Do not eat a lot of foods high in solid fats, added sugars, or salt.  Get regular exercise. This is one of the most important things you can do for your health. ? Most adults should exercise for at least 150 minutes each week. The exercise should increase your heart rate and make you sweat (moderate-intensity exercise). ? Most adults should also do strengthening exercises at least twice a week. This is in addition to the moderate-intensity exercise.  Maintain a healthy weight  Body mass index (BMI) is a measurement that can be used to identify possible weight problems. It estimates body fat based on height and weight. Your health care provider can help determine your BMI and help you achieve or maintain a healthy weight.  For females 25 years of age and older: ? A BMI below 18.5 is considered underweight. ? A BMI of 18.5 to 24.9 is normal. ? A BMI of 25 to 29.9 is considered overweight. ? A BMI of 30 and above is considered obese.  Watch levels of cholesterol and blood lipids  You should start having your blood tested for lipids and cholesterol at 48 years of age, then  have this test every 5 years.  You may need to have your cholesterol levels checked more often if: ? Your lipid or cholesterol levels are high. ? You are older than 48 years of age. ? You are at high risk for heart disease.  Cancer screening Lung Cancer  Lung cancer screening is recommended for adults 85-21 years old who are at high risk for lung cancer because of a history of smoking.  A yearly low-dose CT scan of the lungs is recommended for people who: ? Currently smoke. ? Have quit within the past 15 years. ? Have at least a 30-pack-year history of smoking. A pack year is smoking an average of one pack of cigarettes a day for 1 year.  Yearly screening should continue until it has been 15 years since you quit.  Yearly screening should stop if you develop a health problem that would prevent you from having lung cancer treatment.  Breast Cancer  Practice breast self-awareness. This means understanding how your breasts normally appear and feel.  It also means doing regular breast self-exams. Let your health care provider know about any changes, no matter how small.  If you are in your 20s or 30s, you should have a clinical breast exam (CBE) by a health care provider every 1-3 years as part of a regular health exam.  If you are 90 or older, have a CBE every year. Also consider having a breast X-ray (mammogram) every year.  If you have a family history of breast cancer, talk to your health care provider about genetic screening.  If you are at high risk for breast cancer, talk to your health care provider about having an MRI and a mammogram every year.  Breast cancer gene (BRCA) assessment is recommended for women who have family members with BRCA-related cancers. BRCA-related cancers include: ? Breast. ? Ovarian. ? Tubal. ? Peritoneal cancers.  Results of the assessment will determine the need for genetic counseling and BRCA1 and BRCA2 testing.  Cervical Cancer Your health  care provider may recommend that you be screened regularly for cancer of the pelvic organs (ovaries, uterus, and vagina). This screening involves a pelvic examination, including checking for microscopic changes to the surface of your cervix (Pap test). You may be encouraged to have this screening done every 3 years, beginning at age 21.  For women ages 30-65, health care providers may recommend pelvic exams and Pap testing every 3 years, or they may recommend the Pap and pelvic exam, combined with testing for human papilloma virus (HPV), every 5 years. Some types of HPV increase your risk of cervical cancer. Testing for HPV may also be done on women of any age with unclear Pap test results.  Other health care providers may not recommend any screening for nonpregnant women who are considered low risk for pelvic cancer and who do not have symptoms. Ask your health care provider if a screening pelvic exam is right for you.  If you have had past treatment for cervical cancer or a condition that could lead to cancer, you need Pap tests and screening for cancer for at least 20 years after your treatment. If Pap tests have been discontinued, your risk factors (such as having a new sexual partner) need to be reassessed to determine if screening should resume. Some women have medical problems that increase the chance of getting cervical cancer. In these cases, your health care provider may recommend more frequent screening and Pap tests.  Colorectal Cancer  This type of cancer can be detected and often prevented.  Routine colorectal cancer screening usually begins at 48 years of age and continues through 48 years of age.  Your health care provider may recommend screening at an earlier age if you have risk factors for colon cancer.  Your health care provider may also recommend using home test kits to check for hidden blood in the stool.  A small camera at the end of a tube can be used to examine your colon  directly (sigmoidoscopy or colonoscopy). This is done to check for the earliest forms of colorectal cancer.  Routine screening usually begins at age 50.  Direct examination of the colon should be repeated every 5-10 years through 48 years of age. However, you may need to be screened more often if early forms of precancerous polyps or small growths are found.  Skin Cancer  Check your skin from head to toe regularly.  Tell your health care provider about any new moles or changes in moles, especially if there is a change in a mole's shape or color.  Also tell your health care provider if you have a mole that is larger than the size of a pencil eraser.  Always use sunscreen. Apply sunscreen liberally and repeatedly throughout the day.  Protect yourself by wearing long sleeves, pants, a wide-brimmed hat, and sunglasses whenever you are outside.  Heart disease, diabetes, and high blood pressure  High blood pressure causes heart disease and   increases the risk of stroke. High blood pressure is more likely to develop in: ? People who have blood pressure in the high end of the normal range (130-139/85-89 mm Hg). ? People who are overweight or obese. ? People who are African American.  If you are 34-35 years of age, have your blood pressure checked every 3-5 years. If you are 32 years of age or older, have your blood pressure checked every year. You should have your blood pressure measured twice-once when you are at a hospital or clinic, and once when you are not at a hospital or clinic. Record the average of the two measurements. To check your blood pressure when you are not at a hospital or clinic, you can use: ? An automated blood pressure machine at a pharmacy. ? A home blood pressure monitor.  If you are between 25 years and 42 years old, ask your health care provider if you should take aspirin to prevent strokes.  Have regular diabetes screenings. This involves taking a blood sample to  check your fasting blood sugar level. ? If you are at a normal weight and have a low risk for diabetes, have this test once every three years after 48 years of age. ? If you are overweight and have a high risk for diabetes, consider being tested at a younger age or more often. Preventing infection Hepatitis B  If you have a higher risk for hepatitis B, you should be screened for this virus. You are considered at high risk for hepatitis B if: ? You were born in a country where hepatitis B is common. Ask your health care provider which countries are considered high risk. ? Your parents were born in a high-risk country, and you have not been immunized against hepatitis B (hepatitis B vaccine). ? You have HIV or AIDS. ? You use needles to inject street drugs. ? You live with someone who has hepatitis B. ? You have had sex with someone who has hepatitis B. ? You get hemodialysis treatment. ? You take certain medicines for conditions, including cancer, organ transplantation, and autoimmune conditions.  Hepatitis C  Blood testing is recommended for: ? Everyone born from [birth year] through 1967. ? Anyone with known risk factors for hepatitis C.  Sexually transmitted infections (STIs)  You should be screened for sexually transmitted infections (STIs) including gonorrhea and chlamydia if: ? You are sexually active and are younger than 48 years of age. ? You are older than 48 years of age and your health care provider tells you that you are at risk for this type of infection. ? Your sexual activity has changed since you were last screened and you are at an increased risk for chlamydia or gonorrhea. Ask your health care provider if you are at risk.  If you do not have HIV, but are at risk, it may be recommended that you take a prescription medicine daily to prevent HIV infection. This is called pre-exposure prophylaxis (PrEP). You are considered at risk if: ? You are sexually active and do not regularly  use condoms or know the HIV status of your partner(s). ? You take drugs by injection. ? You are sexually active with a partner who has HIV.  Talk with your health care provider about whether you are at high risk of being infected with HIV. If you choose to begin PrEP, you should first be tested for HIV. You should then be tested every 3 months for as long as you are taking  PrEP. Pregnancy  If you are premenopausal and you may become pregnant, ask your health care provider about preconception counseling.  If you may become pregnant, take 400 to 800 micrograms (mcg) of folic acid every day.  If you want to prevent pregnancy, talk to your health care provider about birth control (contraception). Osteoporosis and menopause  Osteoporosis is a disease in which the bones lose minerals and strength with aging. This can result in serious bone fractures. Your risk for osteoporosis can be identified using a bone density scan.  If you are 24 years of age or older, or if you are at risk for osteoporosis and fractures, ask your health care provider if you should be screened.  Ask your health care provider whether you should take a calcium or vitamin D supplement to lower your risk for osteoporosis.  Menopause may have certain physical symptoms and risks.  Hormone replacement therapy may reduce some of these symptoms and risks. Talk to your health care provider about whether hormone replacement therapy is right for you. Follow these instructions at home:  Schedule regular health, dental, and eye exams.  Stay current with your immunizations.  Do not use any tobacco products including cigarettes, chewing tobacco, or electronic cigarettes.  If you are pregnant, do not drink alcohol.  If you are breastfeeding, limit how much and how often you drink alcohol.  Limit alcohol intake to no more than 1 drink per day for nonpregnant women. One drink equals 12 ounces of beer, 5 ounces of wine, or 1 ounces  of hard liquor.  Do not use street drugs.  Do not share needles.  Ask your health care provider for help if you need support or information about quitting drugs.  Tell your health care provider if you often feel depressed.  Tell your health care provider if you have ever been abused or do not feel safe at home. This information is not intended to replace advice given to you by your health care provider. Make sure you discuss any questions you have with your health care provider. Document Released: 12/13/2010 Document Revised: 11/05/2015 Document Reviewed: 03/03/2015 Elsevier Interactive Patient Education  [employer].

## 2017-03-17 NOTE — Assessment & Plan Note (Signed)
BP above goal today and will monitor with weight loss. She wishes to wait another 3 months before considering medication.

## 2017-03-17 NOTE — Assessment & Plan Note (Signed)
Checking blood work. Declines HIV screening need. Declines flu shot. Needs colonoscopy at 48. Counseled about sun safety and mole surveillance. Given screening recommendations.

## 2017-03-17 NOTE — Progress Notes (Signed)
   Subjective:    Patient ID: [name], female    DOB: 1970-10-23, 46 y.o.   MRN: [id number]  HPI The patient is a 46 YO female coming in for physical.   PMH, [facility], social history reviewed and updated.   Review of Systems  Constitutional: Negative.   HENT: Negative.   Eyes: Negative.   Respiratory: Negative for cough, chest tightness and shortness of breath.   Cardiovascular: Negative for chest pain, palpitations and leg swelling.  Gastrointestinal: Negative for abdominal distention, abdominal pain, constipation, diarrhea, nausea and vomiting.  Musculoskeletal: Negative.   Skin: Negative.   Neurological: Negative.   Psychiatric/Behavioral: Negative.       Objective:   Physical Exam  Constitutional: She is oriented to person, place, and time. She appears well-developed and well-nourished.  HENT:  Head: Normocephalic and atraumatic.  Eyes: EOM are normal.  Neck: Normal range of motion.  Cardiovascular: Normal rate and regular rhythm.   Pulmonary/Chest: Effort normal and breath sounds normal. No respiratory distress. She has no wheezes. She has no rales.  Abdominal: Soft. Bowel sounds are normal. She exhibits no distension. There is no tenderness. There is no rebound.  Musculoskeletal: She exhibits no edema.  Neurological: She is alert and oriented to person, place, and time. Coordination normal.  Skin: Skin is warm and dry.  Psychiatric: She has a normal mood and affect.   Vitals:   03/17/17 0805  BP: (!) 144/90  Pulse: 86  Temp: 97.9 F (36.6 C)  TempSrc: Oral  Weight: 221 lb (100.2 kg)  Height:  (1.676 m)      Assessment & Plan:

## 2017-03-17 NOTE — Assessment & Plan Note (Signed)
Rx for phentermine for assist in weight loss. She has made diet modifications and exercise.

## 2017-06-11 ENCOUNTER — Other Ambulatory Visit: Payer: Self-pay

## 2017-06-11 ENCOUNTER — Encounter (HOSPITAL_COMMUNITY): Payer: Self-pay | Admitting: *Deleted

## 2017-06-11 ENCOUNTER — Emergency Department (HOSPITAL_COMMUNITY): Payer: BC Managed Care – PPO

## 2017-06-11 ENCOUNTER — Emergency Department (HOSPITAL_COMMUNITY)
Admission: EM | Admit: 2017-06-11 | Discharge: 2017-06-11 | Disposition: A | Discharge status: Home / Self Care | Payer: BC Managed Care – PPO | Attending: Emergency Medicine | Admitting: Emergency Medicine

## 2017-06-11 DIAGNOSIS — N131 Hydronephrosis with ureteral stricture, not elsewhere classified: Secondary | ICD-10-CM | POA: Diagnosis not present

## 2017-06-11 DIAGNOSIS — N201 Calculus of ureter: Secondary | ICD-10-CM | POA: Diagnosis not present

## 2017-06-11 DIAGNOSIS — R109 Unspecified abdominal pain: Secondary | ICD-10-CM

## 2017-06-11 DIAGNOSIS — N134 Hydroureter: Secondary | ICD-10-CM | POA: Insufficient documentation

## 2017-06-11 DIAGNOSIS — R1031 Right lower quadrant pain: Secondary | ICD-10-CM | POA: Diagnosis present

## 2017-06-11 HISTORY — DX: Essential (primary) hypertension: I10

## 2017-06-11 LAB — BASIC METABOLIC PANEL
Anion gap: 7 (ref 5–15)
BUN: 19 mg/dL (ref 6–20)
CO2: 27 mmol/L (ref 22–32)
Calcium: 9.1 mg/dL (ref 8.9–10.3)
Chloride: 102 mmol/L (ref 101–111)
Creatinine, Ser: 0.92 mg/dL (ref 0.44–1.00)
GFR calc Af Amer: >60 mL/min (ref >60)
GFR calc non Af Amer: >60 mL/min (ref >60)
Glucose, Bld: 93 mg/dL (ref 65–99)
Potassium: 3.7 mmol/L (ref 3.5–5.1)
Sodium: 136 mmol/L (ref 135–145)

## 2017-06-11 LAB — URINALYSIS, ROUTINE W REFLEX MICROSCOPIC
Bilirubin Urine: NEGATIVE
Glucose, UA: NEGATIVE mg/dL
Ketones, ur: NEGATIVE mg/dL
Nitrite: NEGATIVE
Protein, ur: 30 mg/dL — AB
Specific Gravity, Urine: 1.023 (ref 1.005–1.030)
pH: 5 (ref 5.0–8.0)

## 2017-06-11 LAB — CBC WITH DIFFERENTIAL/PLATELET
Basophils Absolute: 0 10*3/uL (ref 0.0–0.1)
Basophils Relative: 0 %
Eosinophils Absolute: 0.1 10*3/uL (ref 0.0–0.7)
Eosinophils Relative: 1 %
HCT: 40.4 % (ref 36.0–46.0)
Hemoglobin: 13.5 g/dL (ref 12.0–15.0)
Lymphocytes Relative: 19 %
Lymphs Abs: 1.5 10*3/uL (ref 0.7–4.0)
MCH: 31 pg (ref 26.0–34.0)
MCHC: 33.4 g/dL (ref 30.0–36.0)
MCV: 92.7 fL (ref 78.0–100.0)
Monocytes Absolute: 0.5 10*3/uL (ref 0.1–1.0)
Monocytes Relative: 6 %
Neutro Abs: 5.8 10*3/uL (ref 1.7–7.7)
Neutrophils Relative %: 74 %
Platelets: 425 10*3/uL — ABNORMAL HIGH (ref 150–400)
RBC: 4.36 MIL/uL (ref 3.87–5.11)
RDW: 12.5 % (ref 11.5–15.5)
WBC: 7.8 10*3/uL (ref 4.0–10.5)

## 2017-06-11 LAB — I-STAT BETA HCG BLOOD, ED (MC, WL, AP ONLY): I-stat hCG, quantitative: <5 m[IU]/mL (ref <5)

## 2017-06-11 MED ORDER — HYDROMORPHONE HCL 1 MG/ML IJ SOLN
1.0000 mg | Freq: Once | INTRAMUSCULAR | Status: AC
  Administered 2017-06-11: 1 mg via INTRAVENOUS
  Filled 2017-06-11: qty 1

## 2017-06-11 MED ORDER — ONDANSETRON HCL 4 MG PO TABS: 4.0000 mg | ORAL_TABLET | Freq: Four times a day (QID) | ORAL | 0 refills | Status: DC | PRN

## 2017-06-11 MED ORDER — OXYCODONE-ACETAMINOPHEN 5-325 MG PO TABS: 1.0000 | ORAL_TABLET | ORAL | 0 refills | Status: DC | PRN

## 2017-06-11 MED ORDER — KETOROLAC TROMETHAMINE 15 MG/ML IJ SOLN
15.0000 mg | Freq: Once | INTRAMUSCULAR | Status: AC
  Administered 2017-06-11: 15 mg via INTRAVENOUS
  Filled 2017-06-11: qty 1

## 2017-06-11 MED ORDER — IBUPROFEN 600 MG PO TABS: 600.0000 mg | ORAL_TABLET | Freq: Four times a day (QID) | ORAL | 0 refills | Status: DC | PRN

## 2017-06-11 MED ORDER — SODIUM CHLORIDE 0.9 % IV BOLUS (SEPSIS)
1000.0000 mL | Freq: Once | INTRAVENOUS | Status: AC
  Administered 2017-06-11: 1000 mL via INTRAVENOUS

## 2017-06-11 NOTE — ED Provider Notes (Signed)
[facility]-EMERGENCY DEPT Provider Note   CSN: [id number] Arrival date & time: 06/11/17  1319     History   Chief Complaint Chief Complaint  Patient presents with  . Flank Pain    rt    HPI [name] is a 46 y.o. female.  HPI   46 year old female with right flank pain.  Onset this morning around 830.  Pain is constant.  No appreciable exacerbating relieving factors.  No fevers or chills.  No urinary complaints.  Denies any past history of similar symptoms.  Denies any past abdominal/pelvic surgery.  Has not tried taking anything for it.  Past Medical History:  Diagnosis Date  . Anxiety   . Hypertension     Patient Active Problem List   Diagnosis Date Noted  . Routine general medical examination at a health care facility 01/27/2016  . Elevated blood pressure reading without diagnosis of hypertension 08/06/2015  . Allergic rhinitis 08/06/2015  . Obesity 08/06/2015    Past Surgical History:  Procedure Laterality Date  . OVARIAN CYST REMOVAL      OB History    No data available       Home Medications    Prior to Admission medications   Medication Sig Start Date End Date Taking? Authorizing Provider  fluticasone (FLONASE) 50 MCG/ACT nasal spray PLACE 2 SPRAYS INTO BOTH NOSTRILS DAILY. 11/28/16   Veryl Speakalone, Gregory D, FNP  Multiple Vitamin (MULITIVITAMIN WITH MINERALS) TABS Take 1 tablet by mouth daily.      [provider]  phentermine 37.5 MG capsule Take 1 capsule (37.5 mg total) by mouth every morning. 03/17/17   Myrlene Brokerrawford, Elizabeth A, MD    Family History Family History  Problem Relation Age of Onset  . Stroke Mother   . Hypertension Mother   . Hyperlipidemia Mother   . Hypertension Father   . Hyperlipidemia Father   . Cancer Father        colorectal  . Diabetes Paternal Uncle   . Diabetes Paternal Grandmother     Social History Social History   Tobacco Use  . Smoking status: Never Smoker  . Smokeless  tobacco: Never Used  Substance Use Topics  . Alcohol use: Yes  . Drug use: No     Allergies   Tomato   Review of Systems Review of Systems  All systems reviewed and negative, other than as noted in HPI.  Physical Exam Updated Vital Signs BP (!) 172/114 (BP Location: Left Arm)   Pulse 81   Temp 97.9 F (36.6 C) (Oral)   Resp 17   Ht 5\' 8"  (1.727 m)   Wt 83.9 kg (185 lb)   SpO2 100%   BMI 28.13 kg/m   Physical Exam  Constitutional: She appears well-developed and well-nourished. No distress.  HENT:  Head: Normocephalic and atraumatic.  Eyes: Conjunctivae are normal. Right eye exhibits no discharge. Left eye exhibits no discharge.  Neck: Neck supple.  Cardiovascular: Normal rate, regular rhythm and normal heart sounds. Exam reveals no gallop and no friction rub.  No murmur heard. Pulmonary/Chest: Effort normal and breath sounds normal. No respiratory distress.  Abdominal: Soft. She exhibits no distension. There is tenderness.  Right upper and lower quadrant tenderness without rebound or guarding.  No distention.  Musculoskeletal: She exhibits no edema or tenderness.  Neurological: She is alert.  Skin: Skin is warm and dry.  Psychiatric: She has a normal mood and affect. Her behavior is normal. Thought content normal.  Nursing  note and vitals reviewed.    ED Treatments / Results  Labs (all labs ordered are listed, but only abnormal results are displayed) Labs Reviewed  URINALYSIS, ROUTINE W REFLEX MICROSCOPIC - Abnormal; Notable for the following components:      Result Value   Hgb urine dipstick SMALL (*)    Protein, ur 30 (*)    Leukocytes, UA TRACE (*)    Bacteria, UA FEW (*)    Squamous Epithelial / LPF 0-5 (*)    All other components within normal limits  CBC WITH DIFFERENTIAL/PLATELET - Abnormal; Notable for the following components:   Platelets 425 (*)    All other components within normal limits  BASIC METABOLIC PANEL  I-STAT BETA HCG BLOOD, ED ([facility],  [facility], [facility] ONLY)    EKG  EKG Interpretation None       Radiology No results found.  Procedures Procedures (including critical care time)  Medications Ordered in ED Medications  sodium chloride 0.9 % bolus 1,000 mL (1,000 mLs Intravenous New Bag/Given 06/11/17 1447)  ketorolac (TORADOL) 15 MG/ML injection 15 mg (15 mg Intravenous Given 06/11/17 1447)  HYDROmorphone (DILAUDID) injection 1 mg (1 mg Intravenous Given 06/11/17 1448)     Initial Impression / Assessment and Plan / ED Course  I have reviewed the triage vital signs and the nursing notes.  Pertinent labs & imaging results that were available during my care of the patient were reviewed by me and considered in my medical decision making (see chart for details).  Right ureteral stone.  Symptoms not controlled.  Plan continue symptomatic treatment and expectant management.  Return precautions were discussed.  Urology follow-up as needed otherwise.  Final Clinical Impressions(s) / ED Diagnoses   Final diagnoses:  Right flank pain  Ureteral stone    ED Discharge Orders    None       Raeford RazorKohut, Jolynda Townley, MD 06/11/17 [phone]

## 2017-06-11 NOTE — ED Triage Notes (Signed)
Pt developed rt flank pain this am around 830. Pain has been intense and not moving since time it started. No history of kidney stones.

## 2017-06-11 NOTE — ED Notes (Signed)
Spoke with GreenbriarBilly in lab and he stated to Clinical research associatewriter that he would add the urine culture on due to patient being discharged.

## 2017-06-12 LAB — URINE CULTURE: Culture: NO GROWTH

## 2017-06-23 ENCOUNTER — Ambulatory Visit: Payer: Self-pay | Admitting: Internal Medicine

## 2017-06-28 ENCOUNTER — Encounter: Payer: Self-pay | Admitting: Internal Medicine

## 2017-07-05 ENCOUNTER — Other Ambulatory Visit: Payer: Self-pay | Admitting: Urology

## 2017-07-07 ENCOUNTER — Encounter (HOSPITAL_COMMUNITY): Payer: Self-pay | Admitting: General Practice

## 2017-07-08 ENCOUNTER — Encounter (HOSPITAL_COMMUNITY): Payer: Self-pay | Admitting: Emergency Medicine

## 2017-07-08 ENCOUNTER — Emergency Department (HOSPITAL_COMMUNITY)
Admission: EM | Admit: 2017-07-08 | Discharge: 2017-07-08 | Disposition: A | Discharge status: Home / Self Care | Payer: BC Managed Care – PPO | Attending: Emergency Medicine | Admitting: Emergency Medicine

## 2017-07-08 DIAGNOSIS — R112 Nausea with vomiting, unspecified: Secondary | ICD-10-CM | POA: Insufficient documentation

## 2017-07-08 DIAGNOSIS — Z87442 Personal history of urinary calculi: Secondary | ICD-10-CM | POA: Diagnosis not present

## 2017-07-08 DIAGNOSIS — R109 Unspecified abdominal pain: Secondary | ICD-10-CM

## 2017-07-08 DIAGNOSIS — Z79899 Other long term (current) drug therapy: Secondary | ICD-10-CM | POA: Insufficient documentation

## 2017-07-08 DIAGNOSIS — I10 Essential (primary) hypertension: Secondary | ICD-10-CM | POA: Insufficient documentation

## 2017-07-08 LAB — COMPREHENSIVE METABOLIC PANEL
ALT: 23 U/L (ref 14–54)
AST: 24 U/L (ref 15–41)
Albumin: 4.1 g/dL (ref 3.5–5.0)
Alkaline Phosphatase: 60 U/L (ref 38–126)
Anion gap: 10 (ref 5–15)
BUN: 20 mg/dL (ref 6–20)
CO2: 25 mmol/L (ref 22–32)
Calcium: 9 mg/dL (ref 8.9–10.3)
Chloride: 102 mmol/L (ref 101–111)
Creatinine, Ser: 0.82 mg/dL (ref 0.44–1.00)
GFR calc Af Amer: >60 mL/min (ref >60)
GFR calc non Af Amer: >60 mL/min (ref >60)
Glucose, Bld: 90 mg/dL (ref 65–99)
Potassium: 3.9 mmol/L (ref 3.5–5.1)
Sodium: 137 mmol/L (ref 135–145)
Total Bilirubin: 0.6 mg/dL (ref 0.3–1.2)
Total Protein: 8.5 g/dL — ABNORMAL HIGH (ref 6.5–8.1)

## 2017-07-08 LAB — URINALYSIS, ROUTINE W REFLEX MICROSCOPIC
Bilirubin Urine: NEGATIVE
Glucose, UA: NEGATIVE mg/dL
Ketones, ur: NEGATIVE mg/dL
Leukocytes, UA: NEGATIVE
Nitrite: NEGATIVE
Protein, ur: NEGATIVE mg/dL
Specific Gravity, Urine: 1.031 — ABNORMAL HIGH (ref 1.005–1.030)
pH: 5 (ref 5.0–8.0)

## 2017-07-08 LAB — CBC
HCT: 42 % (ref 36.0–46.0)
Hemoglobin: 14 g/dL (ref 12.0–15.0)
MCH: 30.6 pg (ref 26.0–34.0)
MCHC: 33.3 g/dL (ref 30.0–36.0)
MCV: 91.9 fL (ref 78.0–100.0)
Platelets: 391 10*3/uL (ref 150–400)
RBC: 4.57 MIL/uL (ref 3.87–5.11)
RDW: 12.5 % (ref 11.5–15.5)
WBC: 10.4 10*3/uL (ref 4.0–10.5)

## 2017-07-08 LAB — I-STAT BETA HCG BLOOD, ED (MC, WL, AP ONLY): I-stat hCG, quantitative: <5 m[IU]/mL (ref <5)

## 2017-07-08 LAB — LIPASE, BLOOD: Lipase: 42 U/L (ref 11–51)

## 2017-07-08 MED ORDER — ONDANSETRON 4 MG PO TBDP
4.0000 mg | ORAL_TABLET | Freq: Once | ORAL | Status: AC | PRN
  Administered 2017-07-08: 4 mg via ORAL
  Filled 2017-07-08: qty 1

## 2017-07-08 MED ORDER — HYDROMORPHONE HCL 1 MG/ML IJ SOLN
1.0000 mg | Freq: Once | INTRAMUSCULAR | Status: AC
  Administered 2017-07-08: 1 mg via INTRAVENOUS
  Filled 2017-07-08: qty 1

## 2017-07-08 MED ORDER — ONDANSETRON HCL 4 MG/2ML IJ SOLN
4.0000 mg | Freq: Once | INTRAMUSCULAR | Status: AC
  Administered 2017-07-08: 4 mg via INTRAVENOUS
  Filled 2017-07-08: qty 2

## 2017-07-08 MED ORDER — KETOROLAC TROMETHAMINE 30 MG/ML IJ SOLN
30.0000 mg | Freq: Once | INTRAMUSCULAR | Status: AC
  Administered 2017-07-08: 30 mg via INTRAVENOUS
  Filled 2017-07-08: qty 1

## 2017-07-08 MED ORDER — SODIUM CHLORIDE 0.9 % IV BOLUS (SEPSIS)
1000.0000 mL | Freq: Once | INTRAVENOUS | Status: AC
  Administered 2017-07-08: 1000 mL via INTRAVENOUS

## 2017-07-08 NOTE — ED Triage Notes (Signed)
Patient reports that she has kidney stone that she supposed to have lithotripsy on Thursday. Reports nausea when she woke up without vomiting and took last nausea pill. Called urology office who called in nausea meds to pharmacy and on way there just felt bad to came on in. Denies any issues with urination at this time.

## 2017-07-08 NOTE — Discharge Instructions (Signed)
Your workup showed no evidence of developing infection of your kidney stone and your labs are otherwise reassuring.  As her symptoms have improved, we feel you are safe for discharge home.  Please stay hydrated and follow-up with your urologist for your procedure this week.  Please go fill the prescription for your nausea medication.  If any symptoms change or worsen, please return to the nearest emergency department.

## 2017-07-08 NOTE — ED Provider Notes (Signed)
[facility]-EMERGENCY DEPT Provider Note   CSN: [id number] Arrival date & time: 07/08/17  1126     History   Chief Complaint Chief Complaint  Patient presents with  . Nausea    HPI [name] is a 47 y.o. female.  The history is provided by the patient and medical records. No language interpreter was used.  Flank Pain  This is a chronic problem. The current episode started more than 1 week ago. The problem occurs constantly. The problem has not changed since onset.Associated symptoms include abdominal pain. Pertinent negatives include no chest pain, no headaches and no shortness of breath. Nothing aggravates the symptoms. Nothing relieves the symptoms. She has tried nothing for the symptoms. The treatment provided no relief.  Emesis   This is a chronic problem. The current episode started more than 1 week ago. The problem occurs continuously. The problem has not changed since onset.There has been no fever. Associated symptoms include abdominal pain. Pertinent negatives include no chills, no cough, no diarrhea, no fever, no headaches and no sweats.    Past Medical History:  Diagnosis Date  . History of kidney stones   . Hypertension    does not take medication, only monitoring for now    Patient Active Problem List   Diagnosis Date Noted  . Routine general medical examination at a health care facility 01/27/2016  . Elevated blood pressure reading without diagnosis of hypertension 08/06/2015  . Allergic rhinitis 08/06/2015  . Obesity 08/06/2015    Past Surgical History:  Procedure Laterality Date  . ABLATION  2007  . BUNIONECTOMY Bilateral 1996  . DILATION AND CURETTAGE OF UTERUS     over 20 years ago  . OVARIAN CYST REMOVAL    . TUBAL LIGATION  2005    OB History    No data available       Home Medications    Prior to Admission medications   Medication Sig Start Date End Date Taking? Authorizing Provider  fluticasone (FLONASE)  50 MCG/ACT nasal spray PLACE 2 SPRAYS INTO BOTH NOSTRILS DAILY. 11/28/16   Veryl Speak, FNP  ibuprofen (ADVIL,MOTRIN) 600 MG tablet Take 1 tablet (600 mg total) by mouth every 6 (six) hours as needed. 06/11/17   Raeford Razor, MD  levonorgestrel (MIRENA, 52 MG,) 20 MCG/24HR IUD Place 1 application vaginally once. 07/31/13   [provider]  Multiple Vitamin (MULITIVITAMIN WITH MINERALS) TABS Take 1 tablet by mouth daily.      [provider]  ondansetron (ZOFRAN) 4 MG tablet Take 1 tablet (4 mg total) by mouth 4 (four) times daily as needed for nausea or vomiting. 06/11/17   Raeford Razor, MD  oxyCODONE-acetaminophen (PERCOCET/ROXICET) 5-325 MG tablet Take 1-2 tablets by mouth every 4 (four) hours as needed for severe pain. 06/11/17   Raeford Razor, MD  phentermine 37.5 MG capsule Take 1 capsule (37.5 mg total) by mouth every morning. 03/17/17   Myrlene Broker, MD    Family History Family History  Problem Relation Age of Onset  . Stroke Mother   . Hypertension Mother   . Hyperlipidemia Mother   . Hypertension Father   . Hyperlipidemia Father   . Cancer Father        colorectal  . Diabetes Paternal Uncle   . Diabetes Paternal Grandmother     Social History Social History   Tobacco Use  . Smoking status: Never Smoker  . Smokeless tobacco: Never Used  Substance Use Topics  .  Alcohol use: Yes  . Drug use: No     Allergies   Tomato   Review of Systems Review of Systems  Constitutional: Negative for chills, fatigue and fever.  HENT: Negative for congestion.   Eyes: Negative for visual disturbance.  Respiratory: Negative for cough, chest tightness and shortness of breath.   Cardiovascular: Negative for chest pain.  Gastrointestinal: Positive for abdominal pain and nausea. Negative for constipation, diarrhea and vomiting.  Genitourinary: Positive for flank pain. Negative for dysuria, frequency and hematuria.  Musculoskeletal: Negative for back  pain, neck pain and neck stiffness.  Neurological: Negative for light-headedness and headaches.  Psychiatric/Behavioral: Negative for agitation.  All other systems reviewed and are negative.    Physical Exam Updated Vital Signs BP (!) 171/102 (BP Location: Left Arm)   Pulse 82   Temp 98 F (36.7 C) (Oral)   Resp 16   SpO2 99%   Physical Exam  Constitutional: She is oriented to person, place, and time. She appears well-developed and well-nourished. No distress.  HENT:  Head: Normocephalic and atraumatic.  Mouth/Throat: Oropharynx is clear and moist. No oropharyngeal exudate.  Eyes: Conjunctivae and EOM are normal. Pupils are equal, round, and reactive to light.  Neck: Normal range of motion.  Cardiovascular: Normal rate and intact distal pulses.  No murmur heard. Pulmonary/Chest: No stridor. No respiratory distress. She has no wheezes. She exhibits no tenderness.  Abdominal: Soft. She exhibits no distension. There is no tenderness. There is no guarding.  Musculoskeletal: She exhibits tenderness. She exhibits no edema.  Neurological: She is alert and oriented to person, place, and time. She displays normal reflexes. No sensory deficit.  Skin: Capillary refill takes less than 2 seconds. No rash noted. She is not diaphoretic. No erythema.  Psychiatric: She has a normal mood and affect.  Nursing note and vitals reviewed.    ED Treatments / Results  Labs (all labs ordered are listed, but only abnormal results are displayed) Labs Reviewed  COMPREHENSIVE METABOLIC PANEL - Abnormal; Notable for the following components:      Result Value   Total Protein 8.5 (*)    All other components within normal limits  URINALYSIS, ROUTINE W REFLEX MICROSCOPIC - Abnormal; Notable for the following components:   Specific Gravity, Urine 1.031 (*)    Hgb urine dipstick SMALL (*)    Bacteria, UA RARE (*)    Squamous Epithelial / LPF 0-5 (*)    All other components within normal limits  URINE  CULTURE  LIPASE, BLOOD  CBC  I-STAT BETA HCG BLOOD, ED ([facility], [facility], [facility] ONLY)    EKG  EKG Interpretation None       Radiology No results found.  Procedures Procedures (including critical care time)  Medications Ordered in ED Medications  ondansetron (ZOFRAN-ODT) disintegrating tablet 4 mg (4 mg Oral Given 07/08/17 1153)  ondansetron (ZOFRAN) injection 4 mg (4 mg Intravenous Given 07/08/17 1818)  HYDROmorphone (DILAUDID) injection 1 mg (1 mg Intravenous Given 07/08/17 1818)  ketorolac (TORADOL) 30 MG/ML injection 30 mg (30 mg Intravenous Given 07/08/17 1818)  sodium chloride 0.9 % bolus 1,000 mL (0 mLs Intravenous Stopped 07/08/17 1907)     Initial Impression / Assessment and Plan / ED Course  I have reviewed the triage vital signs and the nursing notes.  Pertinent labs & imaging results that were available during my care of the patient were reviewed by me and considered in my medical decision making (see chart for details).     Lawrence MarseillesMichelle A  Romano is a 47 y.o. female with a recently diagnosed kidney stones scheduled for lithotripsy this week who presents with severe nausea and side pain.  Patient reports that several weeks ago she was found to have a large kidney stone in the right ureter and is scheduled to have a lithotripsy procedure done in several days.  Patient reports that she has been using her nausea medication and pain medicine at home but ran out of the medications.  She reports that this morning, she called her urologist who is going to call her in more nausea medication but while on the way to the pharmacy, patient had severe nausea and could not make it.  Patient says that she was dry heaving without full vomiting.  She denies fevers, chills, or change in urination.  She denies any dysuria or hematuria.  She reports that her flank pain has been constant since diagnosis and it has not significantly worsened.  She does however describe it as 10 out of 10 in severity.  She denies  other locations of pain, congestion, cough, or any GI symptoms otherwise.  On exam, patient has tenderness in the.  No CVA tenderness.  Abdomen otherwise nontender.  Lungs clear and chest is nontender.    A shared decision making conversation was held with patient as to extent of workup needed.  Has patient's primary complaint was nausea, patient will be given nausea medication and fluids.  Patient will have screening laboratory testing to look for worsened kidney function or I abnormalities in the setting of the decreased oral intake with nausea.  As the patient reports her pain is similar, do not feel that ultrasound would change the care plan given her upcoming procedure and patient is agreeable to symptom management with likely plan for discharge.  Urinalysis will be rechecked to look for infection.    Anticipate reassessment after workup.      Diagnostic testing results are seen above.  Urinalysis showed no evidence of nitrites or leukocytes.  Doubt development of UTI.  Pregnancy test negative.  Lipase not elevated, metabolic panel reassuring, and no evidence of abnormalities on CBC.  As symptoms have improved of her medications, patient was felt to be stable for discharge home.  Suspect patient has having symptoms due to the kidney stone when she is getting treated this week.  As patient already has a prescription for nausea medication prescribed, patient will go fill it tonight.  Do not feel patient needs repeat prescription and patient does not want this.  Patient has pain medication at home and reports her symptoms have improved.  Patient feels safe for discharge home.  Patient understands return precautions for new or worsened symptoms and will follow up with her urology team.  Patient had no other questions or concerns and was discharged in good condition.    Final Clinical Impressions(s) / ED Diagnoses   Final diagnoses:  Non-intractable vomiting with nausea, unspecified vomiting type    Flank pain    ED Discharge Orders    None      Clinical Impression: 1. Non-intractable vomiting with nausea, unspecified vomiting type   2. Flank pain     Disposition: Discharge  Condition: Good  I have discussed the results, Dx and Tx plan with the pt(& family if present). He/she/they expressed understanding and agree(s) with the plan. Discharge instructions discussed at great length. Strict return precautions discussed and pt &/or family have verbalized understanding of the instructions. No further questions at time of discharge.  Discharge Medication List as of 07/08/2017 10:38 PM      Follow Up: Myrlene Broker, MD [address] [place] [address] [phone]     [facility] [address] [id number] [place]c [address] [place] [address] [phone]       Malgorzata Albert, Canary Brim, MD 07/08/17 [phone]

## 2017-07-10 ENCOUNTER — Other Ambulatory Visit: Payer: Self-pay | Admitting: Family

## 2017-07-10 LAB — URINE CULTURE: Culture: <10000 — AB

## 2017-07-13 ENCOUNTER — Ambulatory Visit (HOSPITAL_COMMUNITY)
Admission: RE | Admit: 2017-07-13 | Discharge: 2017-07-13 | Disposition: A | Discharge status: Home / Self Care | Payer: BC Managed Care – PPO | Source: Ambulatory Visit | Attending: Urology | Admitting: Urology

## 2017-07-13 ENCOUNTER — Encounter (HOSPITAL_COMMUNITY): Payer: Self-pay | Admitting: *Deleted

## 2017-07-13 ENCOUNTER — Ambulatory Visit (HOSPITAL_COMMUNITY): Payer: BC Managed Care – PPO

## 2017-07-13 ENCOUNTER — Other Ambulatory Visit: Payer: Self-pay

## 2017-07-13 ENCOUNTER — Encounter (HOSPITAL_COMMUNITY)
Admission: RE | Disposition: A | Discharge status: Home / Self Care | Payer: Self-pay | Source: Ambulatory Visit | Attending: Urology

## 2017-07-13 DIAGNOSIS — Z79899 Other long term (current) drug therapy: Secondary | ICD-10-CM | POA: Insufficient documentation

## 2017-07-13 DIAGNOSIS — N201 Calculus of ureter: Secondary | ICD-10-CM | POA: Diagnosis present

## 2017-07-13 DIAGNOSIS — E669 Obesity, unspecified: Secondary | ICD-10-CM | POA: Insufficient documentation

## 2017-07-13 DIAGNOSIS — I10 Essential (primary) hypertension: Secondary | ICD-10-CM | POA: Diagnosis not present

## 2017-07-13 DIAGNOSIS — Z87442 Personal history of urinary calculi: Secondary | ICD-10-CM | POA: Diagnosis not present

## 2017-07-13 HISTORY — PX: EXTRACORPOREAL SHOCK WAVE LITHOTRIPSY: SHX1557

## 2017-07-13 HISTORY — DX: Personal history of urinary calculi: Z87.442

## 2017-07-13 SURGERY — LITHOTRIPSY, ESWL
Anesthesia: LOCAL | Laterality: Right

## 2017-07-13 MED ORDER — HYDRALAZINE HCL 20 MG/ML IJ SOLN
INTRAMUSCULAR | Status: AC
  Filled 2017-07-13: qty 1

## 2017-07-13 MED ORDER — DIPHENHYDRAMINE HCL 25 MG PO CAPS
25.0000 mg | ORAL_CAPSULE | ORAL | Status: AC
  Administered 2017-07-13: 25 mg via ORAL
  Filled 2017-07-13: qty 1

## 2017-07-13 MED ORDER — DIAZEPAM 5 MG PO TABS
10.0000 mg | ORAL_TABLET | ORAL | Status: AC
  Administered 2017-07-13: 10 mg via ORAL
  Filled 2017-07-13: qty 2

## 2017-07-13 MED ORDER — SODIUM CHLORIDE 0.9 % IV SOLN
INTRAVENOUS | Status: DC
  Administered 2017-07-13 (×2): via INTRAVENOUS

## 2017-07-13 MED ORDER — SENNOSIDES-DOCUSATE SODIUM 8.6-50 MG PO TABS: 1.0000 | ORAL_TABLET | Freq: Two times a day (BID) | ORAL | 0 refills | Status: DC

## 2017-07-13 MED ORDER — CIPROFLOXACIN HCL 500 MG PO TABS
500.0000 mg | ORAL_TABLET | ORAL | Status: AC
  Administered 2017-07-13: 500 mg via ORAL
  Filled 2017-07-13: qty 1

## 2017-07-13 MED ORDER — OXYCODONE-ACETAMINOPHEN 5-325 MG PO TABS: 1.0000 | ORAL_TABLET | ORAL | 0 refills | Status: DC | PRN

## 2017-07-13 NOTE — H&P (Signed)
Mckenzie PaciniMichelle A Holmes is an 47 y.o. female.    Chief Complaint: Pre-op RIGHT Shockwave Lithotripsy  HPI:   1 - RIGHT Ureteral Stone- 7mm Rt distal fusiorm stone by ER CT 05/2017 on eval flank pain. Stone is solitary, 7mm, SSD 11cm, 820HU and at level of lower sacral spine. Most recetn UCX w/o clonal growth. No interval fevers.  Today "[name]" is seen to proceed with RIGHT shockwave lithotripsy for stone that has not passed with several weeks of medical therapy.   Past Medical History:  Diagnosis Date  . History of kidney stones   . Hypertension    does not take medication, only monitoring for now    Past Surgical History:  Procedure Laterality Date  . ABLATION  2007  . BUNIONECTOMY Bilateral 1996  . DILATION AND CURETTAGE OF UTERUS     over 20 years ago  . OVARIAN CYST REMOVAL    . TUBAL LIGATION  2005    Family History  Problem Relation Age of Onset  . Stroke Mother   . Hypertension Mother   . Hyperlipidemia Mother   . Hypertension Father   . Hyperlipidemia Father   . Cancer Father        colorectal  . Diabetes Paternal Uncle   . Diabetes Paternal Grandmother    Social History:  reports that  has never smoked. she has never used smokeless tobacco. She reports that she drinks alcohol. She reports that she does not use drugs.  Allergies:  Allergies  Allergen Reactions  . Tomato     Makes eczema worse    No medications prior to admission.    No results found for this or any previous visit (from the past 48 hour(s)). No results found.  Review of Systems  Constitutional: Negative.  Negative for chills and fever.  HENT: Negative.   Eyes: Negative.   Respiratory: Negative.   Cardiovascular: Negative.   Gastrointestinal: Negative.   Genitourinary: Positive for flank pain.  Skin: Negative.   Neurological: Negative.   Endo/Heme/Allergies: Negative.   Psychiatric/Behavioral: Negative.     Height 5\' 6"  (1.676 m), weight 93 kg (205 lb). Physical Exam   Constitutional: She appears well-developed.  HENT:  Head: Normocephalic.  Eyes: Pupils are equal, round, and reactive to light.  Neck: Normal range of motion.  Cardiovascular: Normal rate.  Respiratory: Effort normal.  GI:  Moderate truncal obesity  Genitourinary:  Genitourinary Comments: Mild Rt CVAT at present.   Musculoskeletal: Normal range of motion.  Neurological: She is alert.  Skin: Skin is warm.  Psychiatric: She has a normal mood and affect.     Assessment/Plan  1 - RIGHT Ureteral Stone- proceed as planned with RIGHT shockwave lithotripsy. Risks, benefits, alternatives, expected peri-op course discussed previously and reiterated today.   Sebastian AcheMANNY, Jossalin Chervenak, MD 07/13/2017, 6:22 AM

## 2017-07-13 NOTE — Brief Op Note (Signed)
07/13/2017  10:12 AM  PATIENT:  [name]  47 y.o. female  PRE-OPERATIVE DIAGNOSIS:  RIGHT URETERAL CALCULUS  POST-OPERATIVE DIAGNOSIS:  * No post-op diagnosis entered *  PROCEDURE:  Procedure(s): RIGHT EXTRACORPOREAL SHOCK WAVE LITHOTRIPSY (ESWL) (Right)  SURGEON:  Surgeon(s) and Role:    * Alexis Frock, MD - Primary  PHYSICIAN ASSISTANT:   ASSISTANTS: none   ANESTHESIA:   MAC  EBL:   none  BLOOD ADMINISTERED:none  DRAINS: none   LOCAL MEDICATIONS USED:  NONE  SPECIMEN:  No Specimen  DISPOSITION OF SPECIMEN:  N/A  COUNTS:  YES  TOURNIQUET:  * No tourniquets in log *  DICTATION: .Note written in paper chart  PLAN OF CARE: Discharge to home after PACU  PATIENT DISPOSITION:  Short Stay   Delay start of Pharmacological VTE agent (>24hrs) due to surgical blood loss or risk of bleeding: yes

## 2017-07-13 NOTE — Discharge Instructions (Signed)
Moderate Conscious Sedation, Adult, Care After °These instructions provide you with information about caring for yourself after your procedure. Your health care provider may also give you more specific instructions. Your treatment has been planned according to current medical practices, but problems sometimes occur. Call your health care provider if you have any problems or questions after your procedure. °What can I expect after the procedure? °After your procedure, it is common: °· To feel sleepy for several hours. °· To feel clumsy and have poor balance for several hours. °· To have poor judgment for several hours. °· To vomit if you eat too soon. ° °Follow these instructions at home: °For at least 24 hours after the procedure: ° °· Do not: °? Participate in activities where you could fall or become injured. °? Drive. °? Use heavy machinery. °? Drink alcohol. °? Take sleeping pills or medicines that cause drowsiness. °? Make important decisions or sign legal documents. °? Take care of children on your own. °· Rest. °Eating and drinking °· Follow the diet recommended by your health care provider. °· If you vomit: °? Drink water, juice, or soup when you can drink without vomiting. °? Make sure you have little or no nausea before eating solid foods. °General instructions °· Have a responsible adult stay with you until you are awake and alert. °· Take over-the-counter and prescription medicines only as told by your health care provider. °· If you smoke, do not smoke without supervision. °· Keep all follow-up visits as told by your health care provider. This is important. °Contact a health care provider if: °· You keep feeling nauseous or you keep vomiting. °· You feel light-headed. °· You develop a rash. °· You have a fever. °Get help right away if: °· You have trouble breathing. °This information is not intended to replace advice given to you by your health care provider. Make sure you discuss any questions you have  with your health care provider. °Document Released: 03/20/2013 Document Revised: 11/02/2015 Document Reviewed: 09/19/2015 °Elsevier Interactive Patient Education © 2018 [employer] °Lithotripsy, Care After °This sheet gives you information about how to care for yourself after your procedure. Your health care provider may also give you more specific instructions. If you have problems or questions, contact your health care provider. °What can I expect after the procedure? °After the procedure, it is common to have: °· Some blood in your urine. This should only last for a few days. °· Soreness in your back, sides, or upper abdomen for a few days. °· Blotches or bruises on your back where the pressure wave entered the skin. °· Pain, discomfort, or nausea when pieces (fragments) of the kidney stone move through the tube that carries urine from the kidney to the bladder (ureter). Stone fragments may pass soon after the procedure, but they may continue to pass for up to 4-8 weeks. °? If you have severe pain or nausea, contact your health care provider. This may be caused by a large stone that was not broken up, and this may mean that you need more treatment. °· Some pain or discomfort during urination. °· Some pain or discomfort in the lower abdomen or (in men) at the base of the penis. ° °Follow these instructions at home: °Medicines °· Take over-the-counter and prescription medicines only as told by your health care provider. °· If you were prescribed an antibiotic medicine, take it as told by your health care provider. Do not stop taking the antibiotic even if you   start to feel better.  Do not drive for 24 hours if you were given a medicine to help you relax (sedative).  Do not drive or use heavy machinery while taking prescription pain medicine. Eating and drinking  Drink enough water and fluids to keep your urine clear or pale yellow. This helps any remaining pieces of the stone to pass. It can also help  prevent new stones from forming.  Eat plenty of fresh fruits and vegetables.  Follow instructions from your health care provider about eating and drinking restrictions. You may be instructed: ? To reduce how much salt (sodium) you eat or drink. Check ingredients and nutrition facts on packaged foods and beverages. ? To reduce how much meat you eat.  Eat the recommended amount of calcium for your age and gender. Ask your health care provider how much calcium you should have. General instructions  Get plenty of rest.  Most people can resume normal activities 1-2 days after the procedure. Ask your health care provider what activities are safe for you.  If directed, strain all urine through the strainer that was provided by your health care provider. ? Keep all fragments for your health care provider to see. Any stones that are found may be sent to a medical lab for examination. The stone may be as small as a grain of salt.  Keep all follow-up visits as told by your health care provider. This is important. Contact a health care provider if:  You have pain that is severe or does not get better with medicine.  You have nausea that is severe or does not go away.  You have blood in your urine longer than your health care provider told you to expect.  You have more blood in your urine.  You have pain during urination that does not go away.  You urinate more frequently than usual and this does not go away.  You develop a rash or any other possible signs of an allergic reaction. Get help right away if:  You have severe pain in your back, sides, or upper abdomen.  You have severe pain while urinating.  Your urine is very dark red.  You have blood in your stool (feces).  You cannot pass any urine at all.  You feel a strong urge to urinate after emptying your bladder.  You have a fever or chills.  You develop shortness of breath, difficulty breathing, or chest pain.  You have  severe nausea that leads to persistent vomiting.  You faint. Summary  After this procedure, it is common to have some pain, discomfort, or nausea when pieces (fragments) of the kidney stone move through the tube that carries urine from the kidney to the bladder (ureter). If this pain or nausea is severe, however, you should contact your health care provider.  Most people can resume normal activities 1-2 days after the procedure. Ask your health care provider what activities are safe for you.  Drink enough water and fluids to keep your urine clear or pale yellow. This helps any remaining pieces of the stone to pass, and it can help prevent new stones from forming.  If directed, strain your urine and keep all fragments for your health care provider to see. Fragments or stones may be as small as a grain of salt.  Get help right away if you have severe pain in your back, sides, or upper abdomen or have severe pain while urinating. This information is not intended to replace advice  given to you by your health care provider. Make sure you discuss any questions you have with your health care provider. Document Released: 06/19/2007 Document Revised: 04/20/2016 Document Reviewed: 04/20/2016 Elsevier Interactive Patient Education  2018 [employer]    1 - You may have urinary urgency (bladder spasms), pass small stone fragments,  and bloody urine on / off x several days. This is normal.  2 - Call MD or go to ER for fever >102, severe pain / nausea / vomiting not relieved by medications, or acute change in medical status

## 2017-07-14 ENCOUNTER — Encounter (HOSPITAL_COMMUNITY): Payer: Self-pay | Admitting: Urology

## 2017-07-31 ENCOUNTER — Other Ambulatory Visit: Payer: Self-pay | Admitting: Urology

## 2017-08-01 ENCOUNTER — Other Ambulatory Visit: Payer: Self-pay

## 2017-08-01 ENCOUNTER — Encounter (HOSPITAL_BASED_OUTPATIENT_CLINIC_OR_DEPARTMENT_OTHER): Payer: Self-pay

## 2017-08-01 NOTE — Progress Notes (Signed)
Spoke with:  [name]  NPO:  After Midnight, no gum, candy, or mints   Arrival time:  1000AM Labs:  Istat8 AM medications: Flonase, Zofran, Oxycodone if needed Pre op orders: yes Ride home:  Phill MyronJoey Chambers (boyfriend) [phone] or Haze Rushingicole Walden (friend) [phone]

## 2017-08-10 ENCOUNTER — Ambulatory Visit: Payer: Self-pay | Admitting: Internal Medicine

## 2017-08-11 ENCOUNTER — Encounter (HOSPITAL_BASED_OUTPATIENT_CLINIC_OR_DEPARTMENT_OTHER): Payer: Self-pay | Admitting: *Deleted

## 2017-08-11 ENCOUNTER — Ambulatory Visit (HOSPITAL_BASED_OUTPATIENT_CLINIC_OR_DEPARTMENT_OTHER)
Admission: RE | Admit: 2017-08-11 | Discharge: 2017-08-11 | Disposition: A | Discharge status: Home / Self Care | Payer: BC Managed Care – PPO | Source: Ambulatory Visit | Attending: Urology | Admitting: Urology

## 2017-08-11 ENCOUNTER — Other Ambulatory Visit: Payer: Self-pay

## 2017-08-11 ENCOUNTER — Ambulatory Visit (HOSPITAL_BASED_OUTPATIENT_CLINIC_OR_DEPARTMENT_OTHER): Payer: BC Managed Care – PPO | Admitting: Certified Registered Nurse Anesthetist

## 2017-08-11 ENCOUNTER — Encounter (HOSPITAL_BASED_OUTPATIENT_CLINIC_OR_DEPARTMENT_OTHER)
Admission: RE | Disposition: A | Discharge status: Home / Self Care | Payer: Self-pay | Source: Ambulatory Visit | Attending: Urology

## 2017-08-11 DIAGNOSIS — N201 Calculus of ureter: Secondary | ICD-10-CM | POA: Insufficient documentation

## 2017-08-11 DIAGNOSIS — I10 Essential (primary) hypertension: Secondary | ICD-10-CM | POA: Insufficient documentation

## 2017-08-11 DIAGNOSIS — Z87442 Personal history of urinary calculi: Secondary | ICD-10-CM | POA: Insufficient documentation

## 2017-08-11 DIAGNOSIS — Z7951 Long term (current) use of inhaled steroids: Secondary | ICD-10-CM | POA: Diagnosis not present

## 2017-08-11 DIAGNOSIS — Z791 Long term (current) use of non-steroidal anti-inflammatories (NSAID): Secondary | ICD-10-CM | POA: Diagnosis not present

## 2017-08-11 DIAGNOSIS — Z79899 Other long term (current) drug therapy: Secondary | ICD-10-CM | POA: Diagnosis not present

## 2017-08-11 HISTORY — PX: CYSTOSCOPY WITH RETROGRADE PYELOGRAM, URETEROSCOPY AND STENT PLACEMENT: SHX5789

## 2017-08-11 HISTORY — PX: HOLMIUM LASER APPLICATION: SHX5852

## 2017-08-11 LAB — POCT I-STAT, CHEM 8
BUN: 14 mg/dL (ref 6–20)
Calcium, Ion: 1.23 mmol/L (ref 1.15–1.40)
Chloride: 103 mmol/L (ref 101–111)
Creatinine, Ser: 1.1 mg/dL — ABNORMAL HIGH (ref 0.44–1.00)
Glucose, Bld: 81 mg/dL (ref 65–99)
HCT: 39 % (ref 36.0–46.0)
Hemoglobin: 13.3 g/dL (ref 12.0–15.0)
Potassium: 3.7 mmol/L (ref 3.5–5.1)
Sodium: 140 mmol/L (ref 135–145)
TCO2: 25 mmol/L (ref 22–32)

## 2017-08-11 SURGERY — CYSTOURETEROSCOPY, WITH RETROGRADE PYELOGRAM AND STENT INSERTION
Anesthesia: General | Site: Ureter | Laterality: Right

## 2017-08-11 MED ORDER — ONDANSETRON HCL 4 MG/2ML IJ SOLN
INTRAMUSCULAR | Status: DC | PRN
  Administered 2017-08-11: 4 mg via INTRAVENOUS

## 2017-08-11 MED ORDER — MIDAZOLAM HCL 5 MG/5ML IJ SOLN
INTRAMUSCULAR | Status: DC | PRN
  Administered 2017-08-11: 2 mg via INTRAVENOUS

## 2017-08-11 MED ORDER — LIDOCAINE 2% (20 MG/ML) 5 ML SYRINGE
INTRAMUSCULAR | Status: DC | PRN
  Administered 2017-08-11: 100 mg via INTRAVENOUS

## 2017-08-11 MED ORDER — FENTANYL CITRATE (PF) 100 MCG/2ML IJ SOLN
INTRAMUSCULAR | Status: AC
  Filled 2017-08-11: qty 2

## 2017-08-11 MED ORDER — MIDAZOLAM HCL 2 MG/2ML IJ SOLN
INTRAMUSCULAR | Status: AC
  Filled 2017-08-11: qty 2

## 2017-08-11 MED ORDER — IOHEXOL 300 MG/ML  SOLN
INTRAMUSCULAR | Status: DC | PRN
  Administered 2017-08-11: 10 mL

## 2017-08-11 MED ORDER — MEPERIDINE HCL 25 MG/ML IJ SOLN
6.2500 mg | INTRAMUSCULAR | Status: DC | PRN
  Filled 2017-08-11: qty 1

## 2017-08-11 MED ORDER — PROPOFOL 10 MG/ML IV BOLUS
INTRAVENOUS | Status: DC | PRN
  Administered 2017-08-11: 200 mg via INTRAVENOUS

## 2017-08-11 MED ORDER — KETOROLAC TROMETHAMINE 10 MG PO TABS: 10.0000 mg | ORAL_TABLET | Freq: Four times a day (QID) | ORAL | 0 refills | Status: DC | PRN

## 2017-08-11 MED ORDER — LACTATED RINGERS IV SOLN
INTRAVENOUS | Status: DC
  Filled 2017-08-11: qty 1000

## 2017-08-11 MED ORDER — SODIUM CHLORIDE 0.9 % IR SOLN
Status: DC | PRN
  Administered 2017-08-11 (×2): 3000 mL via INTRAVESICAL

## 2017-08-11 MED ORDER — ONDANSETRON HCL 4 MG/2ML IJ SOLN
INTRAMUSCULAR | Status: AC
  Filled 2017-08-11: qty 2

## 2017-08-11 MED ORDER — SENNOSIDES-DOCUSATE SODIUM 8.6-50 MG PO TABS: 1.0000 | ORAL_TABLET | Freq: Two times a day (BID) | ORAL | 0 refills | Status: DC

## 2017-08-11 MED ORDER — PHENYLEPHRINE 40 MCG/ML (10ML) SYRINGE FOR IV PUSH (FOR BLOOD PRESSURE SUPPORT)
PREFILLED_SYRINGE | INTRAVENOUS | Status: DC | PRN
  Administered 2017-08-11 (×3): 80 ug via INTRAVENOUS
  Administered 2017-08-11: 40 ug via INTRAVENOUS
  Administered 2017-08-11: 120 ug via INTRAVENOUS

## 2017-08-11 MED ORDER — GENTAMICIN SULFATE 40 MG/ML IJ SOLN
5.0000 mg/kg | INTRAVENOUS | Status: DC
  Filled 2017-08-11: qty 12

## 2017-08-11 MED ORDER — LACTATED RINGERS IV SOLN
INTRAVENOUS | Status: DC
  Administered 2017-08-11: 11:00:00 via INTRAVENOUS
  Filled 2017-08-11: qty 1000

## 2017-08-11 MED ORDER — METOCLOPRAMIDE HCL 5 MG/ML IJ SOLN
10.0000 mg | Freq: Once | INTRAMUSCULAR | Status: DC | PRN
  Filled 2017-08-11: qty 2

## 2017-08-11 MED ORDER — OXYCODONE-ACETAMINOPHEN 5-325 MG PO TABS: 1.0000 | ORAL_TABLET | ORAL | 0 refills | Status: DC | PRN

## 2017-08-11 MED ORDER — FENTANYL CITRATE (PF) 100 MCG/2ML IJ SOLN
25.0000 ug | INTRAMUSCULAR | Status: DC | PRN
  Filled 2017-08-11: qty 1

## 2017-08-11 MED ORDER — FENTANYL CITRATE (PF) 100 MCG/2ML IJ SOLN
INTRAMUSCULAR | Status: DC | PRN
  Administered 2017-08-11 (×4): 25 ug via INTRAVENOUS

## 2017-08-11 MED ORDER — DEXAMETHASONE SODIUM PHOSPHATE 10 MG/ML IJ SOLN
INTRAMUSCULAR | Status: AC
  Filled 2017-08-11: qty 1

## 2017-08-11 MED ORDER — LIDOCAINE 2% (20 MG/ML) 5 ML SYRINGE
INTRAMUSCULAR | Status: AC
  Filled 2017-08-11: qty 5

## 2017-08-11 MED ORDER — PROPOFOL 10 MG/ML IV BOLUS
INTRAVENOUS | Status: AC
  Filled 2017-08-11: qty 20

## 2017-08-11 MED ORDER — CEPHALEXIN 500 MG PO CAPS: 500.0000 mg | ORAL_CAPSULE | Freq: Two times a day (BID) | ORAL | 0 refills | Status: DC

## 2017-08-11 MED ORDER — GENTAMICIN SULFATE 40 MG/ML IJ SOLN
5.0000 mg/kg | INTRAVENOUS | Status: AC
  Administered 2017-08-11: 370 mg via INTRAVENOUS
  Filled 2017-08-11: qty 9.25

## 2017-08-11 MED ORDER — DEXAMETHASONE SODIUM PHOSPHATE 10 MG/ML IJ SOLN
INTRAMUSCULAR | Status: DC | PRN
  Administered 2017-08-11: 10 mg via INTRAVENOUS

## 2017-08-11 SURGICAL SUPPLY — 20 items
BAG DRAIN URO-CYSTO SKYTR STRL (DRAIN) ×2 IMPLANT
BAG DRN UROCATH (DRAIN) ×1
BASKET LASER NITINOL 1.9FR (BASKET) ×1 IMPLANT
BSKT STON RTRVL 120 1.9FR (BASKET) ×1
CATH INTERMIT  6FR 70CM (CATHETERS) IMPLANT
CLOTH BEACON ORANGE TIMEOUT ST (SAFETY) ×2 IMPLANT
FIBER LASER FLEXIVA 365 (UROLOGICAL SUPPLIES) IMPLANT
FIBER LASER TRAC TIP (UROLOGICAL SUPPLIES) ×1 IMPLANT
GLOVE BIO SURGEON STRL SZ7.5 (GLOVE) ×2 IMPLANT
GOWN STRL REUS W/TWL LRG LVL3 (GOWN DISPOSABLE) ×4 IMPLANT
GUIDEWIRE ANG ZIPWIRE 038X150 (WIRE) ×2 IMPLANT
GUIDEWIRE STR DUAL SENSOR (WIRE) ×2 IMPLANT
INFUSOR MANOMETER BAG 3000ML (MISCELLANEOUS) ×2 IMPLANT
IV NS IRRIG 3000ML ARTHROMATIC (IV SOLUTION) ×3 IMPLANT
KIT TURNOVER CYSTO (KITS) ×2 IMPLANT
MANIFOLD NEPTUNE II (INSTRUMENTS) ×2 IMPLANT
PACK CYSTO (CUSTOM PROCEDURE TRAY) ×2 IMPLANT
STENT POLARIS 5FRX24 (STENTS) ×1 IMPLANT
SYRINGE 10CC LL (SYRINGE) ×2 IMPLANT
TUBE CONNECTING 12X1/4 (SUCTIONS) ×1 IMPLANT

## 2017-08-11 NOTE — Anesthesia Preprocedure Evaluation (Signed)
Anesthesia Evaluation  Patient identified by MRN, date of birth, ID band Patient awake    Reviewed: Allergy & Precautions, NPO status , Patient's Chart, lab work & pertinent test results  Airway Mallampati: II  TM Distance: >3 FB Neck ROM: Full    Dental no notable dental hx.    Pulmonary neg pulmonary ROS,    Pulmonary exam normal breath sounds clear to auscultation       Cardiovascular hypertension, Normal cardiovascular exam Rhythm:Regular Rate:Normal     Neuro/Psych negative neurological ROS  negative psych ROS   GI/Hepatic negative GI ROS, Neg liver ROS,   Endo/Other  negative endocrine ROS  Renal/GU negative Renal ROS  negative genitourinary   Musculoskeletal negative musculoskeletal ROS (+)   Abdominal   Peds negative pediatric ROS (+)  Hematology negative hematology ROS (+)   Anesthesia Other Findings   Reproductive/Obstetrics negative OB ROS                             Anesthesia Physical Anesthesia Plan  ASA: II  Anesthesia Plan: General   Post-op Pain Management:    Induction: Intravenous  PONV Risk Score and Plan: 3 and Ondansetron, Dexamethasone and Treatment may vary due to age or medical condition  Airway Management Planned: LMA  Additional Equipment:   Intra-op Plan:   Post-operative Plan:   Informed Consent: I have reviewed the patients History and Physical, chart, labs and discussed the procedure including the risks, benefits and alternatives for the proposed anesthesia with the patient or authorized representative who has indicated his/her understanding and acceptance.   Dental advisory given  Plan Discussed with: CRNA  Anesthesia Plan Comments: (On phentermine. )        Anesthesia Quick Evaluation

## 2017-08-11 NOTE — Anesthesia Postprocedure Evaluation (Signed)
Anesthesia Post Note  Patient: Felix PaciniMichelle A Gruner  Procedure(s) Performed: CYSTOSCOPY WITH RETROGRADE PYELOGRAM, URETEROSCOPY AND STENT PLACEMENT (Right Ureter) HOLMIUM LASER APPLICATION (Right Ureter)     Patient location during evaluation: PACU Anesthesia Type: General Level of consciousness: awake and alert Pain management: pain level controlled Vital Signs Assessment: post-procedure vital signs reviewed and stable Respiratory status: spontaneous breathing, nonlabored ventilation, respiratory function stable and patient connected to nasal cannula oxygen Cardiovascular status: blood pressure returned to baseline and stable Postop Assessment: no apparent nausea or vomiting Anesthetic complications: no    Last Vitals:  Vitals:   08/11/17 1335 08/11/17 1345  BP: (!) 163/11 (!) 159/113  Pulse: 85 80  Resp: 12 15  Temp: (!) 36.4 C   SpO2: 99% 96%    Last Pain:  Vitals:   08/11/17 0958  TempSrc: Oral                 Phillips Groutarignan, Eden Toohey

## 2017-08-11 NOTE — Brief Op Note (Signed)
08/11/2017  1:24 PM  PATIENT:  Felix PaciniMichelle A Cobern  47 y.o. female  PRE-OPERATIVE DIAGNOSIS:  RIGHT URETERAL STONE  POST-OPERATIVE DIAGNOSIS:  RIGHT URETERAL STONE  PROCEDURE:  Procedure(s): CYSTOSCOPY WITH RETROGRADE PYELOGRAM, URETEROSCOPY AND STENT PLACEMENT (Right) HOLMIUM LASER APPLICATION (Right)  SURGEON:  Surgeon(s) and Role:    * Sebastian AcheManny, Ia Leeb, MD - Primary  PHYSICIAN ASSISTANT:   ASSISTANTS: none   ANESTHESIA:   general  EBL:  Minimal  BLOOD ADMINISTERED:none  DRAINS: none   LOCAL MEDICATIONS USED:  NONE  SPECIMEN:  Source of Specimen:  Right ureteral stone fragments  DISPOSITION OF SPECIMEN:  [facility] for compositional analysis  COUNTS:  YES  TOURNIQUET:  * No tourniquets in log *  DICTATION: .Other Dictation: Dictation Number [id number]  PLAN OF CARE: Discharge to home after PACU  PATIENT DISPOSITION:  PACU - hemodynamically stable.   Delay start of Pharmacological VTE agent (>24hrs) due to surgical blood loss or risk of bleeding: yes

## 2017-08-11 NOTE — Transfer of Care (Signed)
Immediate Anesthesia Transfer of Care Note  Patient: Mckenzie PaciniMichelle A Holmes  Procedure(s) Performed: CYSTOSCOPY WITH RETROGRADE PYELOGRAM, URETEROSCOPY AND STENT PLACEMENT (Right Ureter) HOLMIUM LASER APPLICATION (Right Ureter)  Patient Location: PACU  Anesthesia Type:General  Level of Consciousness: drowsy and patient cooperative  Airway & Oxygen Therapy: Patient Spontanous Breathing and Patient connected to nasal cannula oxygen  Post-op Assessment: Report given to RN and Post -op Vital signs reviewed and stable  Post vital signs: Reviewed and stable  Last Vitals:  Vitals:   08/11/17 0958  BP: (!) 196/112  Pulse: 88  Resp: 18  Temp: 36.6 C  SpO2: 100%    Last Pain:  Vitals:   08/11/17 0958  TempSrc: Oral         Complications: No apparent anesthesia complications

## 2017-08-11 NOTE — Discharge Instructions (Signed)
1 - You may have urinary urgency (bladder spasms) and bloody urine on / off with stent in place. This is normal.  2 - Remove tethered stent on Monday morning at home by pulling on string, then blue-white plastic tubing, and discarding. Office is open Monday if any acute problems arise.   3 - Call MD or go to ER for fever >102, severe pain / nausea / vomiting not relieved by medications, or acute change in medical status.  [facility] [phone] Post Ureteroscopy With or Without Stent Instructions  Definitions:  Ureter: The duct that transports urine from the kidney to the bladder. Stent:   A plastic hollow tube that is placed into the ureter, from the kidney to the                 bladder to prevent the ureter from swelling shut.  GENERAL INSTRUCTIONS:  Despite the fact that no skin incisions were used, the area around the ureter and bladder is raw and irritated. The stent is a foreign body which will further irritate the bladder wall. This irritation is manifested by increased frequency of urination, both day and night, and by an increase in the urge to urinate. In some, the urge to urinate is present almost always. Sometimes the urge is strong enough that you may not be able to stop yourself from urinating. The only real cure is to remove the stent and then give time for the bladder wall to heal which can't be done until the danger of the ureter swelling shut has passed, which varies.  You may see some blood in your urine while the stent is in place and a few days afterwards. Do not be alarmed, even if the urine was clear for a while. Get off your feet and drink lots of fluids until clearing occurs. If you start to pass clots or don't improve, call us.  DIET: You may return to your normal diet immediately. Because of the raw surface of your bladder, alcohol, spicy foods, acid type foods and drinks with caffeine may cause irritation or frequency and should be used in  moderation. To keep your urine flowing freely and to avoid constipation, drink plenty of fluids during the day ( 8-10 glasses ). Tip: Avoid cranberry juice because it is very acidic.  ACTIVITY: Your physical activity doesn't need to be restricted. However, if you are very active, you may see some blood in your urine. We suggest that you reduce your activity under these circumstances until the bleeding has stopped.  BOWELS: It is important to keep your bowels regular during the postoperative period. Straining with bowel movements can cause bleeding. A bowel movement every other day is reasonable. Use a mild laxative if needed, such as Milk of Magnesia 2-3 tablespoons, or 2 Dulcolax tablets. Call if you continue to have problems. If you have been taking narcotics for pain, before, during or after your surgery, you may be constipated. Take a laxative if necessary.   MEDICATION: You should resume your pre-surgery medications unless told not to. In addition you will often be given an antibiotic to prevent infection. These should be taken as prescribed until the bottles are finished unless you are having an unusual reaction to one of the drugs.  PROBLEMS YOU SHOULD REPORT TO [place]:  Fevers over 100.5 Fahrenheit.  Heavy bleeding, or clots ( See above notes about blood in urine ).  Inability to urinate.  Drug reactions ( hives, rash, nausea, vomiting, diarrhea ).  Severe burning or pain with urination that is not improving.  FOLLOW-UP: You will need a follow-up appointment to monitor your progress. Call for this appointment at the number listed above. Usually the first appointment will be about three to fourteen days after your surgery.      Post Anesthesia Home Care Instructions  Activity: Get plenty of rest for the remainder of the day. A responsible individual must stay with you for 24 hours following the procedure.  For the next 24 hours, DO NOT: -Drive a car -Advertising copywriterperate machinery -Drink  alcoholic beverages -Take any medication unless instructed by your physician -Make any legal decisions or sign important papers.  Meals: Start with liquid foods such as gelatin or soup. Progress to regular foods as tolerated. Avoid greasy, spicy, heavy foods. If nausea and/or vomiting occur, drink only clear liquids until the nausea and/or vomiting subsides. Call your physician if vomiting continues.  Special Instructions/Symptoms: Your throat may feel dry or sore from the anesthesia or the breathing tube placed in your throat during surgery. If this causes discomfort, gargle with warm salt water. The discomfort should disappear within 24 hours.

## 2017-08-11 NOTE — H&P (Signed)
Mckenzie PaciniMichelle A Holmes is an 47 y.o. female.    Chief Complaint: Pre-op RIGHT Ureteroscopic Stone Manipulation  HPI:   1 - RIGHT Ureteral Stone - s/p shockwave lithotripsy 06/2017 for right 7mm distal stone. She has passed no fragments and f/u KUB over 1 mo later with stone is similar position. After discussion of options (medical therapy, repeat SWL, proceed with ureteroscopy) she has elected to proceed with right ureteroscopic stone manipulation.   Today "[name]" is seen to proceed with RIGHT ureteroscopic stone manpulation for resudial / refracotry ureteral stone. No interval fevers. Most recent UCX non-clonal.    Past Medical History:  Diagnosis Date  . History of kidney stones   . Hypertension    does not take medication, only monitoring for now    Past Surgical History:  Procedure Laterality Date  . ABLATION  2007  . BUNIONECTOMY Bilateral 1996  . DILATION AND CURETTAGE OF UTERUS     over 20 years ago  . EXTRACORPOREAL SHOCK WAVE LITHOTRIPSY Right 07/13/2017   Procedure: RIGHT EXTRACORPOREAL SHOCK WAVE LITHOTRIPSY (ESWL);  Surgeon: Sebastian AcheManny, Omelia Marquart, MD;  Location: [facility];  Service: Urology;  Laterality: Right;  . OVARIAN CYST REMOVAL    . TUBAL LIGATION  2005    Family History  Problem Relation Age of Onset  . Stroke Mother   . Hypertension Mother   . Hyperlipidemia Mother   . Hypertension Father   . Hyperlipidemia Father   . Cancer Father        colorectal  . Diabetes Paternal Uncle   . Diabetes Paternal Grandmother    Social History:  reports that  has never smoked. she has never used smokeless tobacco. She reports that she does not drink alcohol or use drugs.  Allergies:  Allergies  Allergen Reactions  . Tomato     Makes eczema worse    No medications prior to admission.    No results found for this or any previous visit (from the past 48 hour(s)). No results found.  Review of Systems  Constitutional: Negative.  Negative for chills and fever.  HENT:  Negative.   Eyes: Negative.   Respiratory: Negative.   Cardiovascular: Negative.   Gastrointestinal: Negative.   Genitourinary: Positive for flank pain.  Skin: Negative.   Neurological: Negative.   Endo/Heme/Allergies: Negative.   Psychiatric/Behavioral: Negative.     Height 5\' 6"  (1.676 m), weight 93.4 kg (206 lb). Physical Exam  Constitutional: She appears well-developed.  HENT:  Head: Normocephalic.  Eyes: Pupils are equal, round, and reactive to light.  Neck: Normal range of motion.  Cardiovascular: Normal rate.  Respiratory: Effort normal.  GI: Soft.  Stable truncal obesity.   Genitourinary:  Genitourinary Comments: Mild right CVAT at present.   Musculoskeletal: Normal range of motion.  Skin: Skin is warm.  Psychiatric: She has a normal mood and affect.     Assessment/Plan  Proceed as planned with RIGHT ureteroscopic stone manipulation with goal of stone free. Risks, benefits, alterntives, expected peri-op course discussed in detail.    Sebastian AcheMANNY, Kiran Lapine, MD 08/11/2017, 6:38 AM

## 2017-08-11 NOTE — Anesthesia Procedure Notes (Signed)
Procedure Name: LMA Insertion Date/Time: 08/11/2017 12:51 PM Performed by: Pearson Grippeobertson, Romari Gasparro M, CRNA Pre-anesthesia Checklist: Patient identified, Emergency Drugs available, Suction available and Patient being monitored Patient Re-evaluated:Patient Re-evaluated prior to induction Oxygen Delivery Method: Circle system utilized Preoxygenation: Pre-oxygenation with 100% oxygen Induction Type: IV induction Ventilation: Mask ventilation without difficulty LMA: LMA inserted LMA Size: 4.0 Number of attempts: 1 Airway Equipment and Method: Bite block Placement Confirmation: positive ETCO2 Tube secured with: Tape Dental Injury: Teeth and Oropharynx as per pre-operative assessment

## 2017-08-12 NOTE — Op Note (Deleted)
  The note originally documented on this encounter has been moved the the encounter in which it belongs.  

## 2017-08-12 NOTE — Op Note (Signed)
[name], Holmes           ACCOUNT NO.:  [id number]  MEDICAL RECORD NO.:  [id number]  LOCATION:                                 FACILITY:  PHYSICIAN:  Mckenzie Ache, MD     DATE OF BIRTH:  [birth year]  DATE OF PROCEDURE:  08/11/2017                                OPERATIVE REPORT   PREOPERATIVE DIAGNOSIS:  Retained right ureteral stone.  PROCEDURE: 1. Cystoscopy, right retrograde pyelogram and interpretation. 2. Right ureteroscopy with laser lithotripsy. 3. Insertion of right ureteral stent, 5 x 24 Polaris to proximal in     the renal pelvis and distally in urinary bladder.  INDICATION:  Ms. Buskirk is a very pleasant 47 year old lady with history of recurrent nephrolithiasis.  She was found on workup of a colicky flank pain to have a right distal ureteral stent.  She underwent shockwave lithotripsy for this over a month ago; however, she failed to essentially pass any stone fragments.  Followup imaging revealed that the stone was in the similar location.  Options were discussed for further management including continued medical therapy versus reattempts of shockwave lithotripsy versus ureteroscopy, and she wished to proceed with the latter.  Informed consent was obtained and placed in the medical record.  PROCEDURE IN DETAIL:  The patient being [name] was verified. Procedure being right ureteroscopic stone manipulation was confirmed. Procedure was carried out.  Time-out was performed.  Intravenous antibiotics were administered.  General anesthesia was introduced.  The patient was placed into a low-lithotomy position.  Sterile field was created by prepping and draping the patient's vagina, introitus, and proximal thighs using iodine.  Next, cystourethroscopy was performed using a 22-French rigid cystoscope with offset lens.  Inspection of bladder revealed no diverticula, calcifications, papillary lesions. Ureteral orifices appeared singleton bilaterally.   The right ureteral orifice was cannulated with a 6-French end-hole catheter and right retrograde pyelogram obtained.  Right retrograde pyelogram demonstrated a single right ureter with single-system right kidney.  There was a filling defect in distal ureter consistent with known stone.  A 0.038 Zip wire was advanced to the level of the lower pole, set aside as a safety wire.  An 8-French feeding tube placed in urinary bladder for pressure release.  Next, semi-rigid ureteroscopy was performed of the distal right ureter alongside a separate Sensor working wire.  As expected, there was quite impacted distal stone below the level of iliacs appeared to be much too large and impacted for simple basketing.  As such, holmium laser energy applied to the stone using settings of 0.3 joules and 20 hertz.  The stone was fragmented into approximately 6 smaller pieces.  These were then sequentially grasped on the long axis removed and set aside for compositional analysis.  The remaining distal four-fifth of the ureter was inspected with the semi-rigid ureteroscope and no additional calcifications were found.  As the goal today was to verify stone free. The semi-rigid scope was exchanged for a flexible digital ureteroscope over the Sensor working wire to the level of the kidney and systematic inspection was performed of the kidney including all calices x3.  No additional calcifications were noted.  The ureteroscope was withdrawn under continuous vision.  No  mucosal abnormalities were found of the ureter.  Given the significant impaction of the stone, it was felt that brief interval stenting would be warranted with a tethered stent as such a new 5 x 24 Polaris-type stent was placed using fluoroscopic guidance over the remaining safety wire.  Good proximal and distal deployment were noted.  Tethers were left in place fashioned to the thigh and the procedure was terminated.  The patient tolerated the  procedure well. There were no immediate periprocedural complications.  The patient was taken to the postanesthesia care unit in a stable condition.          ______________________________ Mckenzie Acheheodore Beanca Kiester, MD     TM/MEDQ  D:  08/11/2017  T:  08/12/2017  Job:  [id number]

## 2017-08-14 ENCOUNTER — Encounter (HOSPITAL_BASED_OUTPATIENT_CLINIC_OR_DEPARTMENT_OTHER): Payer: Self-pay | Admitting: Urology

## 2017-08-18 ENCOUNTER — Encounter: Payer: Self-pay | Admitting: Internal Medicine

## 2017-08-18 ENCOUNTER — Ambulatory Visit: Payer: BC Managed Care – PPO | Admitting: Internal Medicine

## 2017-08-18 DIAGNOSIS — I10 Essential (primary) hypertension: Secondary | ICD-10-CM

## 2017-08-18 MED ORDER — AMLODIPINE BESYLATE 10 MG PO TABS: 10.0000 mg | ORAL_TABLET | Freq: Every day | ORAL | 3 refills | Status: DC

## 2017-08-18 NOTE — Patient Instructions (Signed)
We have sent in the amlodipine to take 1 pill daily.    

## 2017-08-19 ENCOUNTER — Encounter: Payer: Self-pay | Admitting: Internal Medicine

## 2017-08-19 NOTE — Progress Notes (Signed)
   Subjective:    Patient ID: [name], female    DOB: 1970/07/21, 47 y.o.   MRN: [id number]  HPI The patient is a 47 YO female coming in for follow up of her blood pressure. We have been following it for some time and she has been reluctant to start medication. Recently she has had kidney stones and her blood pressure was very high through all these treatments and this worried her. She is willing to try medicine now. She denies headaches or chest pain or SOB. She denies abdominal pain or nausea. The kidney stones are now resolved.   Review of Systems  Constitutional: Negative.   HENT: Negative.   Eyes: Negative.   Respiratory: Negative for cough, chest tightness and shortness of breath.   Cardiovascular: Negative for chest pain, palpitations and leg swelling.  Gastrointestinal: Negative for abdominal distention, abdominal pain, constipation, diarrhea, nausea and vomiting.  Musculoskeletal: Negative.   Skin: Negative.   Neurological: Negative.   Psychiatric/Behavioral: Negative.       Objective:   Physical Exam  Constitutional: She is oriented to person, place, and time. She appears well-developed and well-nourished.  HENT:  Head: Normocephalic and atraumatic.  Eyes: EOM are normal.  Neck: Normal range of motion.  Cardiovascular: Normal rate and regular rhythm.  Pulmonary/Chest: Effort normal and breath sounds normal. No respiratory distress. She has no wheezes. She has no rales.  Abdominal: Soft. Bowel sounds are normal. She exhibits no distension. There is no tenderness. There is no rebound.  Musculoskeletal: She exhibits no edema.  Neurological: She is alert and oriented to person, place, and time. Coordination normal.  Skin: Skin is warm and dry.  Psychiatric: She has a normal mood and affect.   Vitals:   08/18/17 1525 08/18/17 1551  BP: (!) 170/110 (!) 150/110  Pulse: 89   Temp: (!) 97.5 F (36.4 C)   TempSrc: Oral   SpO2: 99%   Weight: 210 lb (95.3 kg)     Height: 5\' 6"  (1.676 m)       Assessment & Plan:

## 2017-08-19 NOTE — Assessment & Plan Note (Signed)
Rx for amlodipine 10 mg daily. BP readings at home are running 150s/90s typically. Return in 1-2 months.

## 2017-09-04 ENCOUNTER — Ambulatory Visit: Payer: Self-pay | Admitting: Internal Medicine

## 2017-09-07 ENCOUNTER — Other Ambulatory Visit: Payer: Self-pay | Admitting: Family

## 2017-09-21 ENCOUNTER — Other Ambulatory Visit: Payer: Self-pay | Admitting: Otolaryngology

## 2017-09-21 DIAGNOSIS — J3489 Other specified disorders of nose and nasal sinuses: Secondary | ICD-10-CM

## 2017-09-21 DIAGNOSIS — J329 Chronic sinusitis, unspecified: Secondary | ICD-10-CM

## 2017-09-22 ENCOUNTER — Other Ambulatory Visit: Payer: Self-pay | Admitting: Family

## 2017-09-29 ENCOUNTER — Other Ambulatory Visit: Payer: Self-pay | Admitting: Family

## 2017-12-02 ENCOUNTER — Other Ambulatory Visit: Payer: Self-pay | Admitting: Internal Medicine

## 2018-04-18 ENCOUNTER — Encounter: Payer: Self-pay | Admitting: Internal Medicine

## 2018-04-23 ENCOUNTER — Ambulatory Visit (INDEPENDENT_AMBULATORY_CARE_PROVIDER_SITE_OTHER): Payer: BC Managed Care – PPO | Admitting: Internal Medicine

## 2018-04-23 ENCOUNTER — Other Ambulatory Visit (INDEPENDENT_AMBULATORY_CARE_PROVIDER_SITE_OTHER): Payer: BC Managed Care – PPO

## 2018-04-23 ENCOUNTER — Encounter: Payer: Self-pay | Admitting: Internal Medicine

## 2018-04-23 VITALS — BP 130/90 | HR 104 | Temp 97.9°F | Ht 66.0 in | Wt 224.0 lb

## 2018-04-23 DIAGNOSIS — I10 Essential (primary) hypertension: Secondary | ICD-10-CM

## 2018-04-23 DIAGNOSIS — Z0000 Encounter for general adult medical examination without abnormal findings: Secondary | ICD-10-CM | POA: Diagnosis not present

## 2018-04-23 DIAGNOSIS — Z6836 Body mass index (BMI) 36.0-36.9, adult: Secondary | ICD-10-CM

## 2018-04-23 DIAGNOSIS — E6609 Other obesity due to excess calories: Secondary | ICD-10-CM

## 2018-04-23 DIAGNOSIS — J3089 Other allergic rhinitis: Secondary | ICD-10-CM | POA: Diagnosis not present

## 2018-04-23 LAB — COMPREHENSIVE METABOLIC PANEL
ALT: 13 U/L (ref 0–35)
AST: 15 U/L (ref 0–37)
Albumin: 4.3 g/dL (ref 3.5–5.2)
Alkaline Phosphatase: 63 U/L (ref 39–117)
BUN: 18 mg/dL (ref 6–23)
CO2: 29 mEq/L (ref 19–32)
Calcium: 9.4 mg/dL (ref 8.4–10.5)
Chloride: 104 mEq/L (ref 96–112)
Creatinine, Ser: 0.87 mg/dL (ref 0.40–1.20)
GFR: 88.88 mL/min (ref >60.00)
Glucose, Bld: 69 mg/dL — ABNORMAL LOW (ref 70–99)
Potassium: 3.8 mEq/L (ref 3.5–5.1)
Sodium: 140 mEq/L (ref 135–145)
Total Bilirubin: 0.4 mg/dL (ref 0.2–1.2)
Total Protein: 8.2 g/dL (ref 6.0–8.3)

## 2018-04-23 LAB — HEMOGLOBIN A1C: Hgb A1c MFr Bld: 4.9 % (ref 4.6–6.5)

## 2018-04-23 LAB — LIPID PANEL
Cholesterol: 213 mg/dL — ABNORMAL HIGH (ref 0–200)
HDL: 48.1 mg/dL (ref >39.00)
LDL Cholesterol: 145 mg/dL — ABNORMAL HIGH (ref 0–99)
NonHDL: 165.04
Total CHOL/HDL Ratio: 4
Triglycerides: 100 mg/dL (ref 0.0–149.0)
VLDL: 20 mg/dL (ref 0.0–40.0)

## 2018-04-23 LAB — CBC
HCT: 40.8 % (ref 36.0–46.0)
Hemoglobin: 13.7 g/dL (ref 12.0–15.0)
MCHC: 33.7 g/dL (ref 30.0–36.0)
MCV: 89.3 fl (ref 78.0–100.0)
Platelets: 414 10*3/uL — ABNORMAL HIGH (ref 150.0–400.0)
RBC: 4.57 Mil/uL (ref 3.87–5.11)
RDW: 13.3 % (ref 11.5–15.5)
WBC: 5.8 10*3/uL (ref 4.0–10.5)

## 2018-04-23 MED ORDER — MONTELUKAST SODIUM 10 MG PO TABS: 10.0000 mg | ORAL_TABLET | Freq: Every day | ORAL | 3 refills | Status: DC

## 2018-04-23 MED ORDER — AMLODIPINE BESYLATE 10 MG PO TABS: 10.0000 mg | ORAL_TABLET | Freq: Every day | ORAL | 3 refills | Status: DC

## 2018-04-23 NOTE — Patient Instructions (Addendum)
We have sent in singulair for the allergies to take 1 pill daily.   Sent [place] a mychart message close to your birthday to get in for the colonoscopy (like June).   Health Maintenance, Female Adopting a healthy lifestyle and getting preventive care can go a long way to promote health and wellness. Talk with your health care provider about what schedule of regular examinations is right for you. This is a good chance for you to check in with your provider about disease prevention and staying healthy. In between checkups, there are plenty of things you can do on your own. Experts have done a lot of research about which lifestyle changes and preventive measures are most likely to keep you healthy. Ask your health care provider for more information. Weight and diet Eat a healthy diet  Be sure to include plenty of vegetables, fruits, low-fat dairy products, and lean protein.  Do not eat a lot of foods high in solid fats, added sugars, or salt.  Get regular exercise. This is one of the most important things you can do for your health. ? Most adults should exercise for at least 150 minutes each week. The exercise should increase your heart rate and make you sweat (moderate-intensity exercise). ? Most adults should also do strengthening exercises at least twice a week. This is in addition to the moderate-intensity exercise.  Maintain a healthy weight  Body mass index (BMI) is a measurement that can be used to identify possible weight problems. It estimates body fat based on height and weight. Your health care provider can help determine your BMI and help you achieve or maintain a healthy weight.  For females 49 years of age and older: ? A BMI below 18.5 is considered underweight. ? A BMI of 18.5 to 24.9 is normal. ? A BMI of 25 to 29.9 is considered overweight. ? A BMI of 30 and above is considered obese.  Watch levels of cholesterol and blood lipids  You should start having your blood tested for  lipids and cholesterol at 49 years of age, then have this test every 5 years.  You may need to have your cholesterol levels checked more often if: ? Your lipid or cholesterol levels are high. ? You are older than 49 years of age. ? You are at high risk for heart disease.  Cancer screening Lung Cancer  Lung cancer screening is recommended for adults 49-47 years old who are at high risk for lung cancer because of a history of smoking.  A yearly low-dose CT scan of the lungs is recommended for people who: ? Currently smoke. ? Have quit within the past 15 years. ? Have at least a 30-pack-year history of smoking. A pack year is smoking an average of one pack of cigarettes a day for 1 year.  Yearly screening should continue until it has been 15 years since you quit.  Yearly screening should stop if you develop a health problem that would prevent you from having lung cancer treatment.  Breast Cancer  Practice breast self-awareness. This means understanding how your breasts normally appear and feel.  It also means doing regular breast self-exams. Let your health care provider know about any changes, no matter how small.  If you are in your 49s or 30s, you should have a clinical breast exam (CBE) by a health care provider every 1-3 years as part of a regular health exam.  If you are 49 or older, have a CBE every year. Also  consider having a breast X-ray (mammogram) every year.  If you have a family history of breast cancer, talk to your health care provider about genetic screening.  If you are at high risk for breast cancer, talk to your health care provider about having an MRI and a mammogram every year.  Breast cancer gene (BRCA) assessment is recommended for women who have family members with BRCA-related cancers. BRCA-related cancers include: ? Breast. ? Ovarian. ? Tubal. ? Peritoneal cancers.  Results of the assessment will determine the need for genetic counseling and BRCA1 and  BRCA2 testing.  Cervical Cancer Your health care provider may recommend that you be screened regularly for cancer of the pelvic organs (ovaries, uterus, and vagina). This screening involves a pelvic examination, including checking for microscopic changes to the surface of your cervix (Pap test). You may be encouraged to have this screening done every 3 years, beginning at age 49.  For women ages 49-65, health care providers may recommend pelvic exams and Pap testing every 3 years, or they may recommend the Pap and pelvic exam, combined with testing for human papilloma virus (HPV), every 5 years. Some types of HPV increase your risk of cervical cancer. Testing for HPV may also be done on women of any age with unclear Pap test results.  Other health care providers may not recommend any screening for nonpregnant women who are considered low risk for pelvic cancer and who do not have symptoms. Ask your health care provider if a screening pelvic exam is right for you.  If you have had past treatment for cervical cancer or a condition that could lead to cancer, you need Pap tests and screening for cancer for at least 20 years after your treatment. If Pap tests have been discontinued, your risk factors (such as having a new sexual partner) need to be reassessed to determine if screening should resume. Some women have medical problems that increase the chance of getting cervical cancer. In these cases, your health care provider may recommend more frequent screening and Pap tests.  Colorectal Cancer  This type of cancer can be detected and often prevented.  Routine colorectal cancer screening usually begins at 49 years of age and continues through 49 years of age.  Your health care provider may recommend screening at an earlier age if you have risk factors for colon cancer.  Your health care provider may also recommend using home test kits to check for hidden blood in the stool.  A small camera at the  end of a tube can be used to examine your colon directly (sigmoidoscopy or colonoscopy). This is done to check for the earliest forms of colorectal cancer.  Routine screening usually begins at age 49.  Direct examination of the colon should be repeated every 5-10 years through 49 years of age. However, you may need to be screened more often if early forms of precancerous polyps or small growths are found.  Skin Cancer  Check your skin from head to toe regularly.  Tell your health care provider about any new moles or changes in moles, especially if there is a change in a mole's shape or color.  Also tell your health care provider if you have a mole that is larger than the size of a pencil eraser.  Always use sunscreen. Apply sunscreen liberally and repeatedly throughout the day.  Protect yourself by wearing long sleeves, pants, a wide-brimmed hat, and sunglasses whenever you are outside.  Heart disease, diabetes, and high blood  pressure  High blood pressure causes heart disease and increases the risk of stroke. High blood pressure is more likely to develop in: ? People who have blood pressure in the high end of the normal range (130-139/85-89 mm Hg). ? People who are overweight or obese. ? People who are African American.  If you are 18-39 years of age, have your blood pressure checked every 3-5 years. If you are 40 years of age or older, have your blood pressure checked every year. You should have your blood pressure measured twice-once when you are at a hospital or clinic, and once when you are not at a hospital or clinic. Record the average of the two measurements. To check your blood pressure when you are not at a hospital or clinic, you can use: ? An automated blood pressure machine at a pharmacy. ? A home blood pressure monitor.  If you are between 55 years and 79 years old, ask your health care provider if you should take aspirin to prevent strokes.  Have regular diabetes  screenings. This involves taking a blood sample to check your fasting blood sugar level. ? If you are at a normal weight and have a low risk for diabetes, have this test once every three years after 49 years of age. ? If you are overweight and have a high risk for diabetes, consider being tested at a younger age or more often. Preventing infection Hepatitis B  If you have a higher risk for hepatitis B, you should be screened for this virus. You are considered at high risk for hepatitis B if: ? You were born in a country where hepatitis B is common. Ask your health care provider which countries are considered high risk. ? Your parents were born in a high-risk country, and you have not been immunized against hepatitis B (hepatitis B vaccine). ? You have HIV or AIDS. ? You use needles to inject street drugs. ? You live with someone who has hepatitis B. ? You have had sex with someone who has hepatitis B. ? You get hemodialysis treatment. ? You take certain medicines for conditions, including cancer, organ transplantation, and autoimmune conditions.  Hepatitis C  Blood testing is recommended for: ? Everyone born from 1945 through 1965. ? Anyone with known risk factors for hepatitis C.  Sexually transmitted infections (STIs)  You should be screened for sexually transmitted infections (STIs) including gonorrhea and chlamydia if: ? You are sexually active and are younger than 49 years of age. ? You are older than 49 years of age and your health care provider tells you that you are at risk for this type of infection. ? Your sexual activity has changed since you were last screened and you are at an increased risk for chlamydia or gonorrhea. Ask your health care provider if you are at risk.  If you do not have HIV, but are at risk, it may be recommended that you take a prescription medicine daily to prevent HIV infection. This is called pre-exposure prophylaxis (PrEP). You are considered at risk  if: ? You are sexually active and do not regularly use condoms or know the HIV status of your partner(s). ? You take drugs by injection. ? You are sexually active with a partner who has HIV.  Talk with your health care provider about whether you are at high risk of being infected with HIV. If you choose to begin PrEP, you should first be tested for HIV. You should then be tested every   3 months for as long as you are taking PrEP. Pregnancy  If you are premenopausal and you may become pregnant, ask your health care provider about preconception counseling.  If you may become pregnant, take 400 to 800 micrograms (mcg) of folic acid every day.  If you want to prevent pregnancy, talk to your health care provider about birth control (contraception). Osteoporosis and menopause  Osteoporosis is a disease in which the bones lose minerals and strength with aging. This can result in serious bone fractures. Your risk for osteoporosis can be identified using a bone density scan.  If you are 65 years of age or older, or if you are at risk for osteoporosis and fractures, ask your health care provider if you should be screened.  Ask your health care provider whether you should take a calcium or vitamin D supplement to lower your risk for osteoporosis.  Menopause may have certain physical symptoms and risks.  Hormone replacement therapy may reduce some of these symptoms and risks. Talk to your health care provider about whether hormone replacement therapy is right for you. Follow these instructions at home:  Schedule regular health, dental, and eye exams.  Stay current with your immunizations.  Do not use any tobacco products including cigarettes, chewing tobacco, or electronic cigarettes.  If you are pregnant, do not drink alcohol.  If you are breastfeeding, limit how much and how often you drink alcohol.  Limit alcohol intake to no more than 1 drink per day for nonpregnant women. One drink  equals 12 ounces of beer, 5 ounces of wine, or 1 ounces of hard liquor.  Do not use street drugs.  Do not share needles.  Ask your health care provider for help if you need support or information about quitting drugs.  Tell your health care provider if you often feel depressed.  Tell your health care provider if you have ever been abused or do not feel safe at home. This information is not intended to replace advice given to you by your health care provider. Make sure you discuss any questions you have with your health care provider. Document Released: 12/13/2010 Document Revised: 11/05/2015 Document Reviewed: 03/03/2015 Elsevier Interactive Patient Education  2018 [employer]vier Inc.  

## 2018-04-23 NOTE — Assessment & Plan Note (Signed)
Weight is stable and she wants to work on this. Has struggled with for a long time.

## 2018-04-23 NOTE — Assessment & Plan Note (Signed)
Wants to switch from flonase to singulair due to concerns about steroid causing her to gain weight although we discussed that this is unlikely.

## 2018-04-23 NOTE — Assessment & Plan Note (Signed)
BP at goal on amlodipine 10 mg daily. Checking CMP and adjust as needed.  

## 2018-04-23 NOTE — Progress Notes (Signed)
   Subjective:    Patient ID: [name], female    DOB: 02-Jul-1970, 47 y.o.   MRN: [id number]  HPI The patient is a 47 YO female coming in for physical.   PMH, [facility], social history reviewed and updated.   Review of Systems  Constitutional: Negative.   HENT: Positive for congestion and sinus pressure.   Eyes: Negative.   Respiratory: Negative for cough, chest tightness and shortness of breath.   Cardiovascular: Negative for chest pain, palpitations and leg swelling.  Gastrointestinal: Negative for abdominal distention, abdominal pain, constipation, diarrhea, nausea and vomiting.  Musculoskeletal: Negative.   Skin: Negative.   Neurological: Negative.   Psychiatric/Behavioral: Negative.       Objective:   Physical Exam  Constitutional: She is oriented to person, place, and time. She appears well-developed and well-nourished.  Overweight  HENT:  Head: Normocephalic and atraumatic.  Eyes: EOM are normal.  Neck: Normal range of motion.  Cardiovascular: Normal rate and regular rhythm.  Pulmonary/Chest: Effort normal and breath sounds normal. No respiratory distress. She has no wheezes. She has no rales.  Abdominal: Soft. Bowel sounds are normal. She exhibits no distension. There is no tenderness. There is no rebound.  Musculoskeletal: She exhibits no edema.  Neurological: She is alert and oriented to person, place, and time. Coordination normal.  Skin: Skin is warm and dry.  Psychiatric: She has a normal mood and affect.   Vitals:   04/23/18 1442  BP: 130/90  Pulse: (!) 104  Temp: 97.9 F (36.6 C)  TempSrc: Oral  SpO2: 97%  Weight: 224 lb (101.6 kg)  Height: 5\' 6"  (1.676 m)      Assessment & Plan:

## 2018-04-23 NOTE — Assessment & Plan Note (Signed)
Flu shot declined. Tetanus up to date. Mammogram up to date with gyn, pap smear up to date with gyn. Counseled about sun safety and mole surveillance. Counseled about the dangers of distracted driving. Given 10 year screening recommendations.

## 2019-01-01 ENCOUNTER — Ambulatory Visit (INDEPENDENT_AMBULATORY_CARE_PROVIDER_SITE_OTHER)
Admission: RE | Admit: 2019-01-01 | Discharge: 2019-01-01 | Disposition: A | Discharge status: Home / Self Care | Payer: BC Managed Care – PPO | Source: Ambulatory Visit

## 2019-01-01 DIAGNOSIS — Z20828 Contact with and (suspected) exposure to other viral communicable diseases: Secondary | ICD-10-CM

## 2019-01-01 DIAGNOSIS — Z20822 Contact with and (suspected) exposure to covid-19: Secondary | ICD-10-CM

## 2019-01-01 NOTE — Discharge Instructions (Signed)
COVID testing ordered.  Please go to [facility] [place].  It will take between 5-7 days for results.    In the meantime: You should remain isolated in your home for 7 days from symptom onset AND greater than 72 hours after symptoms resolution (absence of fever without the use of fever-reducing medication and improvement in respiratory symptoms), whichever is longer Get plenty of rest and push fluids Use OTC medications like ibuprofen or tylenol as needed fever or pain Call or go to the ED if you have any new or worsening symptoms such as fever, worsening cough, shortness of breath, chest tightness, chest pain, turning blue, changes in mental status, etc..Marland Kitchen

## 2019-01-01 NOTE — ED Provider Notes (Signed)
[facility]    Virtual Visit via Video Note:  Mckenzie PaciniMichelle A Varone  initiated request for [facility] with [facility]. I connected with Mckenzie PaciniMichelle A Gatlin  on 01/01/2019 at 3:43 PM  for a synchronized telemedicine visit using a video enabled HIPPA compliant telemedicine application. I verified that I am speaking with Mckenzie PaciniMichelle A Jaspers  using two identifiers. Rennis HardingBrittany Cal Gindlesperger, PA-C  was physically located in a [facility] site and Mckenzie PaciniMichelle A Heatwole was located at a different location.   The limitations of evaluation and management by telemedicine as well as the availability of in-person appointments were discussed. Patient was informed that she  may incur a bill ( including co-pay) for this virtual visit encounter. Mckenzie PaciniMichelle A Kasparek  expressed understanding and gave verbal consent to proceed with virtual visit.  [id number] 01/01/19 Arrival Time: 1525  Cc: COVID testing  SUBJECTIVE:  [name] is a 48 y.o. female who presents for COVID testing.  Admits to possible exposure 3-4 days ago.  Friend's daughter tested positive for COVID and spent time with friend 3-4 days ago.  Friend got tested and her results are pending.  Denies hx of COVID.   Denies fever, chills, fatigue, congestion, rhinorrhea, sore throat, SOB, cough, chest pain, chest pressure, nausea, changes in bowel or bladder habits.    ROS: As per HPI.  All other pertinent ROS negative.     Past Medical History:  Diagnosis Date  . History of kidney stones   . Hypertension    does not take medication, only monitoring for now   Past Surgical History:  Procedure Laterality Date  . ABLATION  2007  . BUNIONECTOMY Bilateral 1996  . CYSTOSCOPY WITH RETROGRADE PYELOGRAM, URETEROSCOPY AND STENT PLACEMENT Right 08/11/2017   Procedure: CYSTOSCOPY WITH RETROGRADE PYELOGRAM, URETEROSCOPY AND STENT PLACEMENT;  Surgeon: Sebastian AcheManny, Theodore, MD;  [facility] [place];   Service: Urology;  Laterality: Right;  . DILATION AND CURETTAGE OF UTERUS     over 20 years ago  . EXTRACORPOREAL SHOCK WAVE LITHOTRIPSY Right 07/13/2017   Procedure: RIGHT EXTRACORPOREAL SHOCK WAVE LITHOTRIPSY (ESWL);  Surgeon: Sebastian AcheManny, Theodore, MD;  Location: [facility];  Service: Urology;  Laterality: Right;  . HOLMIUM LASER APPLICATION Right 08/11/2017   Procedure: HOLMIUM LASER APPLICATION;  Surgeon: Sebastian AcheManny, Theodore, MD;  [facility] [place];  Service: Urology;  Laterality: Right;  . OVARIAN CYST REMOVAL    . TUBAL LIGATION  2005   Allergies  Allergen Reactions  . Tomato     Makes eczema worse   No current facility-administered medications on file prior to encounter.    Current Outpatient Medications on File Prior to Encounter  Medication Sig Dispense Refill  . amLODipine (NORVASC) 10 MG tablet Take 1 tablet (10 mg total) by mouth daily. 90 tablet 3  . levonorgestrel (MIRENA, 52 MG,) 20 MCG/24HR IUD Place 1 application vaginally once.    . montelukast (SINGULAIR) 10 MG tablet Take 1 tablet (10 mg total) by mouth at bedtime. 90 tablet 3  . Multiple Vitamin (MULITIVITAMIN WITH MINERALS) TABS Take 1 tablet by mouth daily.         OBJECTIVE:  There were no vitals filed for this visit.   General appearance: alert; no distress Eyes: EOMI grossly HENT: normocephalic; atraumatic Neck: supple with FROM Lungs: normal respiratory effort; speaking in full sentences without difficulty Extremities: moves extremities without difficulty Skin: No obvious rashes Neurologic: No facial asymmetries Psychological: alert and cooperative; normal mood  and affect   ASSESSMENT & PLAN:  1. Exposure to Covid-19 Virus     COVID testing ordered.  Please go to [facility] [place].  It will take between 5-7 days for results.    In the meantime: You should remain isolated in your home for 7 days from symptom onset AND greater than 72 hours after symptoms resolution (absence  of fever without the use of fever-reducing medication and improvement in respiratory symptoms), whichever is longer Get plenty of rest and push fluids Use OTC medications like ibuprofen or tylenol as needed fever or pain Call or go to the ED if you have any new or worsening symptoms such as fever, worsening cough, shortness of breath, chest tightness, chest pain, turning blue, changes in mental status, etc...  I discussed the assessment and treatment plan with the patient. The patient was provided an opportunity to ask questions and all were answered. The patient agreed with the plan and demonstrated an understanding of the instructions.   The patient was advised to call back or seek an in-person evaluation if the symptoms worsen or if the condition fails to improve as anticipated.  I provided 15 minutes of non-face-to-face time during this encounter.  [place], PA-C  01/01/2019 3:43 PM           Lestine Box, PA-C 01/01/19 1543

## 2019-01-02 ENCOUNTER — Encounter: Payer: Self-pay | Admitting: Internal Medicine

## 2019-01-16 ENCOUNTER — Ambulatory Visit (AMBULATORY_SURGERY_CENTER): Payer: Self-pay | Admitting: *Deleted

## 2019-01-16 ENCOUNTER — Other Ambulatory Visit: Payer: Self-pay

## 2019-01-16 VITALS — Ht 66.0 in | Wt 239.0 lb

## 2019-01-16 DIAGNOSIS — Z1211 Encounter for screening for malignant neoplasm of colon: Secondary | ICD-10-CM

## 2019-01-16 NOTE — Progress Notes (Signed)
Patient is here for PV today. Patient denies any allergies to eggs or soy. Patient denies any problems with anesthesia/sedation. Patient denies any oxygen use at home. Patient denies taking any diet/weight loss medications or blood thinners. EMMI education assisgned to patient on colonoscopy, this was explained and instructions given to patient.Pt is aware that care partner will wait in the car during procedure; if they feel like they will be too hot to wait in the car; they may wait in the lobby.  We want them to wear a mask (we do not have any that we can provide them), practice social distancing, and we will check their temperatures when they get here.  I did remind patient that their care partner needs to stay in the parking lot the entire time. Pt will wear mask into building. 

## 2019-01-28 ENCOUNTER — Encounter: Payer: Self-pay | Admitting: Internal Medicine

## 2019-01-29 ENCOUNTER — Telehealth: Payer: Self-pay

## 2019-01-29 NOTE — Telephone Encounter (Signed)
Covid-19 screening questions   Do you now or have you had a fever in the last 14 days?  Do you have any respiratory symptoms of shortness of breath or cough now or in the last 14 days?  Do you have any family members or close contacts with diagnosed or suspected Covid-19 in the past 14 days?  Have you been tested for Covid-19 and found to be positive?      Unable to reach patient. No answer/ mailbox full.

## 2019-01-29 NOTE — Telephone Encounter (Signed)
Patient answered "no" to all questions.  

## 2019-01-30 ENCOUNTER — Encounter: Payer: Self-pay | Admitting: Internal Medicine

## 2019-01-30 ENCOUNTER — Ambulatory Visit (AMBULATORY_SURGERY_CENTER): Payer: BC Managed Care – PPO | Admitting: Internal Medicine

## 2019-01-30 ENCOUNTER — Other Ambulatory Visit: Payer: Self-pay

## 2019-01-30 VITALS — BP 152/91 | HR 74 | Temp 98.4°F | Resp 18 | Ht 66.0 in | Wt 239.0 lb

## 2019-01-30 DIAGNOSIS — Z1211 Encounter for screening for malignant neoplasm of colon: Secondary | ICD-10-CM

## 2019-01-30 MED ORDER — FLEET ENEMA 7-19 GM/118ML RE ENEM: 1.0000 | ENEMA | Freq: Once | RECTAL | 0 refills | Status: DC

## 2019-01-30 MED ORDER — FLEET ENEMA 7-19 GM/118ML RE ENEM
1.0000 | ENEMA | Freq: Once | RECTAL | Status: AC
  Administered 2019-01-30: 1 via RECTAL

## 2019-01-30 MED ORDER — SODIUM CHLORIDE 0.9 % IV SOLN: 500.0000 mL | Freq: Once | INTRAVENOUS | Status: DC

## 2019-01-30 NOTE — Op Note (Addendum)
[facility] Patient Name: Adalberto ColeMichelle Christofferson Procedure Date: 01/30/2019 8:34 AM MRN: [id number] Endoscopist: Iva Booparl E Johnica Armwood , MD Age: [id number] Referring MD:  Date of Birth: 08/15/1970 Gender: Female Account #: [id number] Procedure:                Colonoscopy Indications:              Screening for colorectal malignant neoplasm Medicines:                Propofol per Anesthesia, Monitored Anesthesia Care Procedure:                Pre-Anesthesia Assessment:                           - Prior to the procedure, a History and Physical                            was performed, and patient medications and                            allergies were reviewed. The patient's tolerance of                            previous anesthesia was also reviewed. The risks                            and benefits of the procedure and the sedation                            options and risks were discussed with the patient.                            All questions were answered, and informed consent                            was obtained. Prior Anticoagulants: The patient has                            taken no previous anticoagulant or antiplatelet                            agents. ASA Grade Assessment: II - A patient with                            mild systemic disease. After reviewing the risks                            and benefits, the patient was deemed in                            satisfactory condition to undergo the procedure.                           After obtaining informed consent, the colonoscope  was passed under direct vision. Throughout the                            procedure, the patient's blood pressure, pulse, and                            oxygen saturations were monitored continuously. The                            Colonoscope was introduced through the anus and                            advanced to the the cecum, identified by    appendiceal orifice and ileocecal valve. The                            ileocecal valve, appendiceal orifice, and rectum                            were photographed. The quality of the bowel                            preparation was good. The bowel preparation used                            was Miralax via split dose instruction. Scope In: 8:45:11 AM Scope Out: 9:00:28 AM Scope Withdrawal Time: 0 hours 11 minutes 29 seconds  Total Procedure Duration: 0 hours 15 minutes 17 seconds  Findings:                 The perianal and digital rectal examinations were                            normal.                           Multiple small-mouthed diverticula were found in                            the sigmoid colon.                           The exam was otherwise without abnormality on                            direct and retroflexion views. Complications:            No immediate complications. Estimated blood loss:                            None. Estimated Blood Loss:     Estimated blood loss: none. Recommendation:           - Repeat colonoscopy in 10 years for screening                            purposes.                           -  Resume previous diet.                           - Continue present medications. Choose different                            prep next time - she had vomiting this time. Gatha Mayer, MD 01/30/2019 9:09:21 AM This report has been signed electronically.

## 2019-01-30 NOTE — Patient Instructions (Addendum)
NO POLYPS!  You do have diverticulosis - thickened muscle rings and pouches in the colon wall. Please read the handout about this condition.  Next routine colonoscopy or other screening test in 10 years - 2030  I appreciate the opportunity to care for you. Gatha Mayer, MD, [facility]   Handout on diverticulosis given to you today   YOU HAD AN ENDOSCOPIC PROCEDURE TODAY AT [place]:   Refer to the procedure report that was given to you for any specific questions about what was found during the examination.  If the procedure report does not answer your questions, please call your gastroenterologist to clarify.  If you requested that your care partner not be given the details of your procedure findings, then the procedure report has been included in a sealed envelope for you to review at your convenience later.  YOU SHOULD EXPECT: Some feelings of bloating in the abdomen. Passage of more gas than usual.  Walking can help get rid of the air that was put into your GI tract during the procedure and reduce the bloating. If you had a lower endoscopy (such as a colonoscopy or flexible sigmoidoscopy) you may notice spotting of blood in your stool or on the toilet paper. If you underwent a bowel prep for your procedure, you may not have a normal bowel movement for a few days.  Please Note:  You might notice some irritation and congestion in your nose or some drainage.  This is from the oxygen used during your procedure.  There is no need for concern and it should clear up in a day or so.  SYMPTOMS TO REPORT IMMEDIATELY:   Following lower endoscopy (colonoscopy or flexible sigmoidoscopy):  Excessive amounts of blood in the stool  Significant tenderness or worsening of abdominal pains  Swelling of the abdomen that is new, acute  Fever of 100F or higher    For urgent or emergent issues, a gastroenterologist can be reached at any hour by calling [phone].   DIET:   We do recommend a small meal at first, but then you may proceed to your regular diet.  Drink plenty of fluids but you should avoid alcoholic beverages for 24 hours.  ACTIVITY:  You should plan to take it easy for the rest of today and you should NOT DRIVE or use heavy machinery until tomorrow (because of the sedation medicines used during the test).    FOLLOW UP: Our staff will call the number listed on your records 48-72 hours following your procedure to check on you and address any questions or concerns that you may have regarding the information given to you following your procedure. If we do not reach you, we will leave a message.  We will attempt to reach you two times.  During this call, we will ask if you have developed any symptoms of COVID 19. If you develop any symptoms (ie: fever, flu-like symptoms, shortness of breath, cough etc.) before then, please call [phone].  If you test positive for Covid 19 in the 2 weeks post procedure, please call and report this information to [place].    If any biopsies were taken you will be contacted by phone or by letter within the next 1-3 weeks.  Please call us at [phone] if you have not heard about the biopsies in 3 weeks.    SIGNATURES/CONFIDENTIALITY: You and/or your care partner have signed paperwork which will be entered into your electronic medical record.  These  signatures attest to the fact that that the information above on your After Visit Summary has been reviewed and is understood.  Full responsibility of the confidentiality of this discharge information lies with you and/or your care-partner. 

## 2019-01-30 NOTE — Progress Notes (Signed)
Pt's states no medical or surgical changes since previsit or office visit.  June- Covid questions [place]

## 2019-01-30 NOTE — Progress Notes (Signed)
Spoke with Dr. Carlean Purl regarding patient with "Muddy water" diarrhea after prep, order for fleets enema given. Pt was tan liquid results after enema.

## 2019-01-30 NOTE — Progress Notes (Signed)
PT taken to PACU. Monitors in place. VSS. Report given to RN. 

## 2019-02-01 ENCOUNTER — Telehealth: Payer: Self-pay

## 2019-02-01 NOTE — Telephone Encounter (Signed)
  Follow up Call-  Call back number 01/30/2019  Post procedure Call Back phone  # [phone]  Permission to leave phone message Yes  Some recent data might be hidden     Patient questions:  Do you have a fever, pain , or abdominal swelling? No. Pain Score  0 *  Have you tolerated food without any problems? Yes.    Have you been able to return to your normal activities? Yes.    Do you have any questions about your discharge instructions: Diet   No. Medications  No. Follow up visit  No.  Do you have questions or concerns about your Care? No.  Actions: * If pain score is 4 or above: No action needed, pain <4.  1. Have you developed a fever since your procedure? no  2.   Have you had an respiratory symptoms (SOB or cough) since your procedure? no  3.   Have you tested positive for COVID 19 since your procedure no  4.   Have you had any family members/close contacts diagnosed with the COVID 19 since your procedure?  no   If yes to any of these questions please route to Joylene John, RN and Alphonsa Gin, Therapist, sports.

## 2019-02-28 IMAGING — CT CT RENAL STONE PROTOCOL
2 of 4 series · 16 of 46 positions shown, 18 images · non-contrast
Comparison: None.

CLINICAL DATA: Right flank pain since this morning.

EXAM:
CT ABDOMEN AND PELVIS WITHOUT CONTRAST
TECHNIQUE: Multidetector CT imaging of the abdomen and pelvis was performed
following the standard protocol without IV contrast.

[Series 2: axial st · axial · 0.88mm/px · z∈[-470,-40]mm · 13 of 98 slices shown, 15 images]
[im 6/98  soft-tissue]
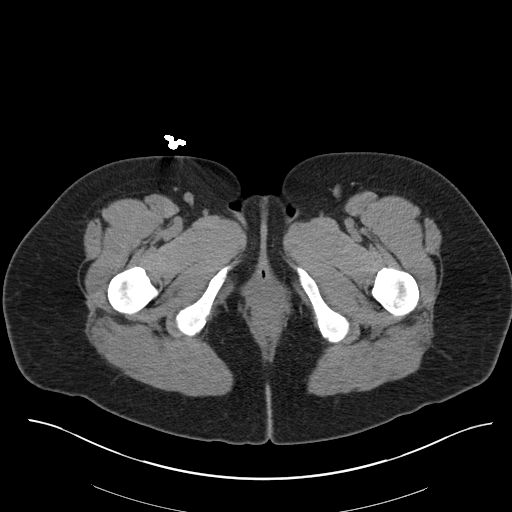
[im 6/98  bone]
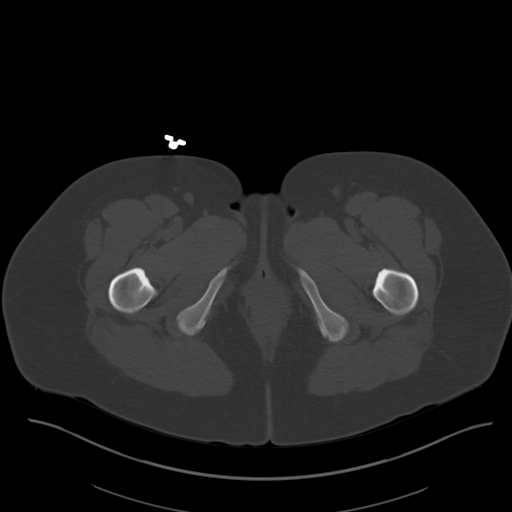
[im 16/98  soft-tissue]
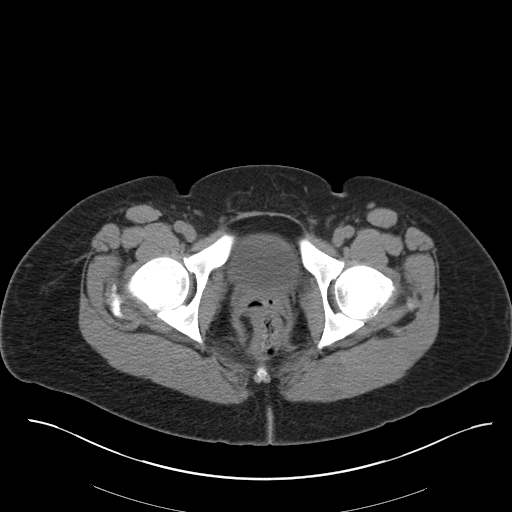
[im 21/98  soft-tissue]
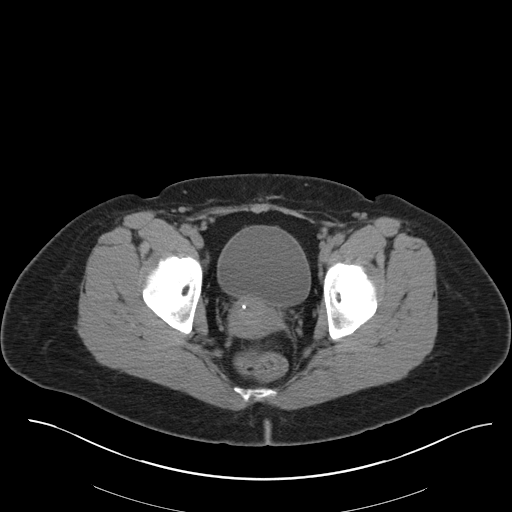
[im 26/98  soft-tissue]
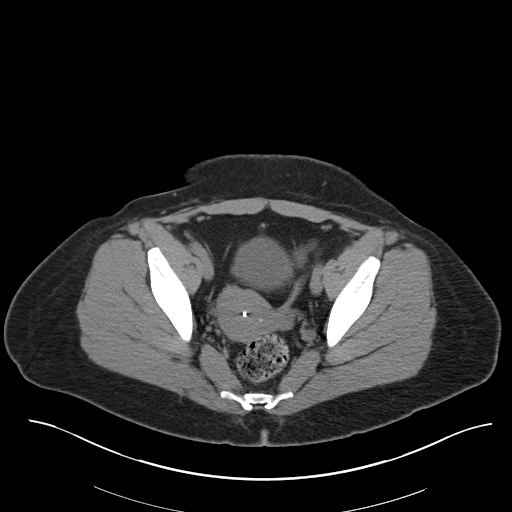
[im 36/98  soft-tissue]
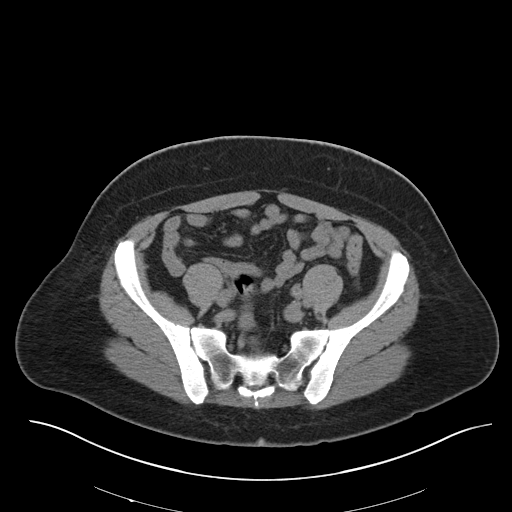
[im 41/98  soft-tissue]
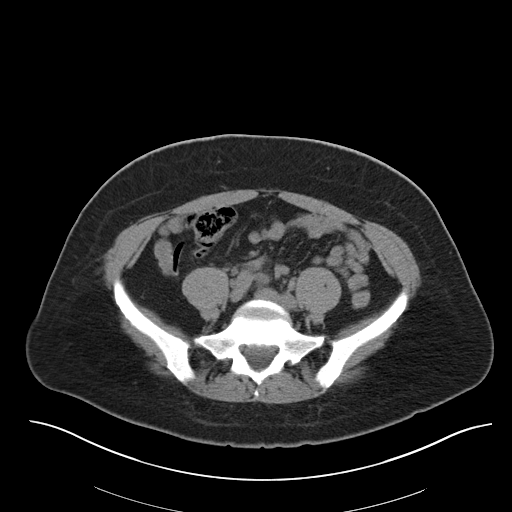
[im 52/98  soft-tissue]
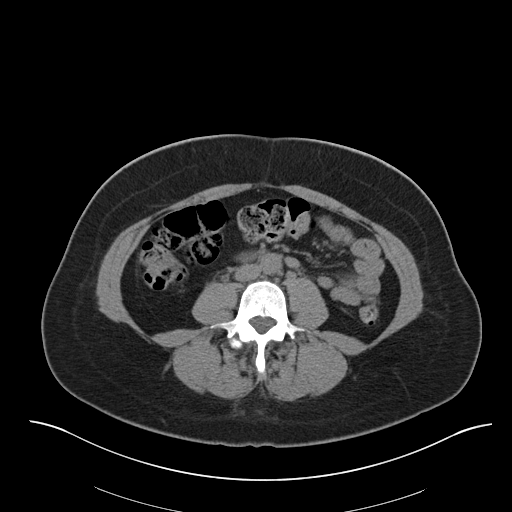
[im 57/98  soft-tissue]
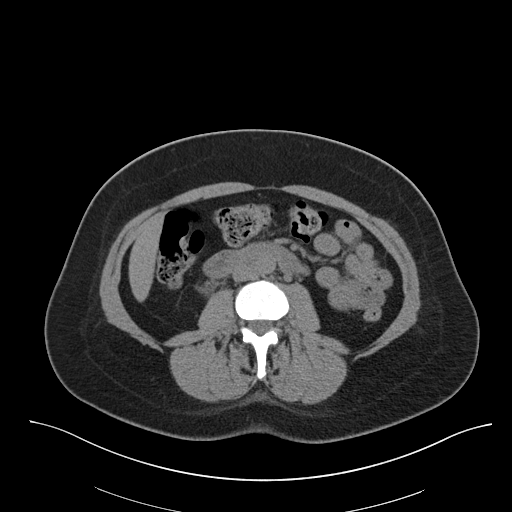
[im 62/98  soft-tissue]
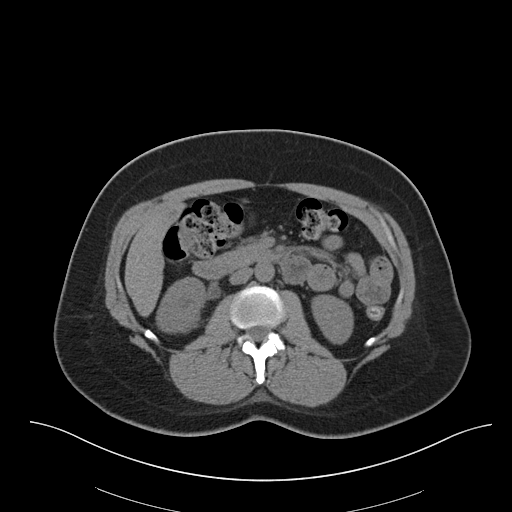
[im 62/98  bone]
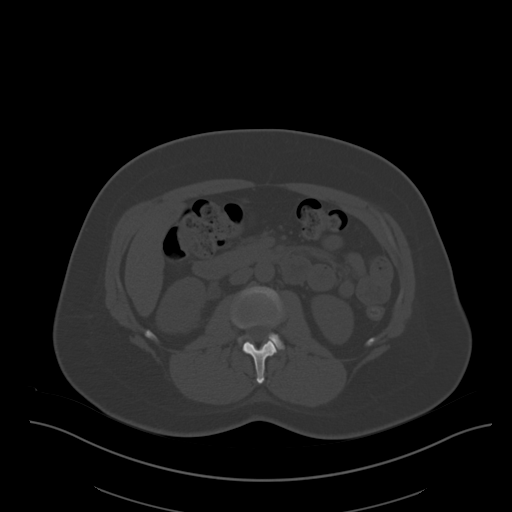
[im 72/98  soft-tissue]
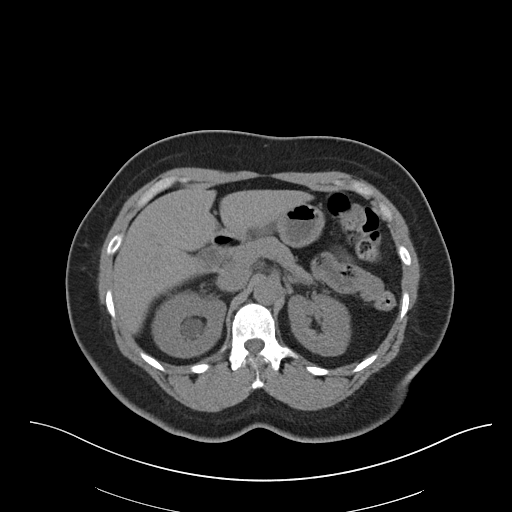
[im 77/98  soft-tissue]
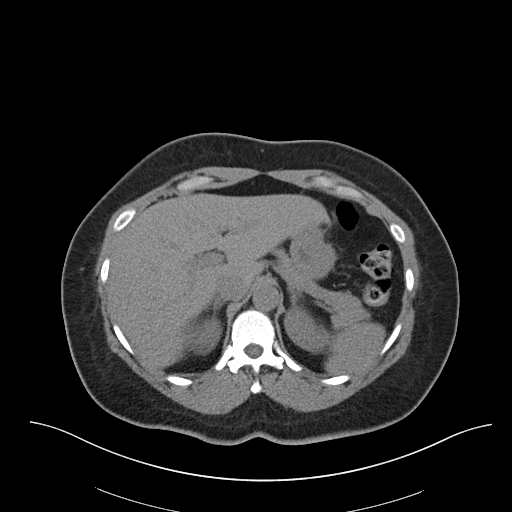
[im 82/98  soft-tissue]
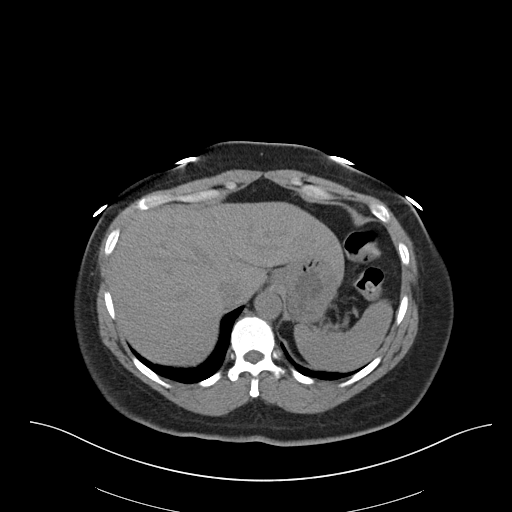
[im 92/98  soft-tissue]
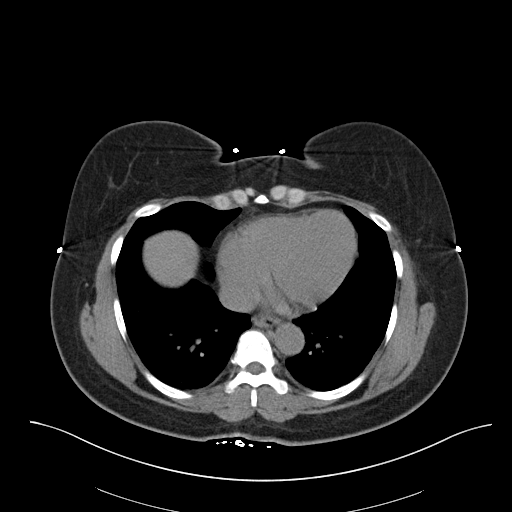

[Series 5: coronal · coronal · 0.72mm/px · 3 of 132 slices shown]
[im 44/132  soft-tissue]
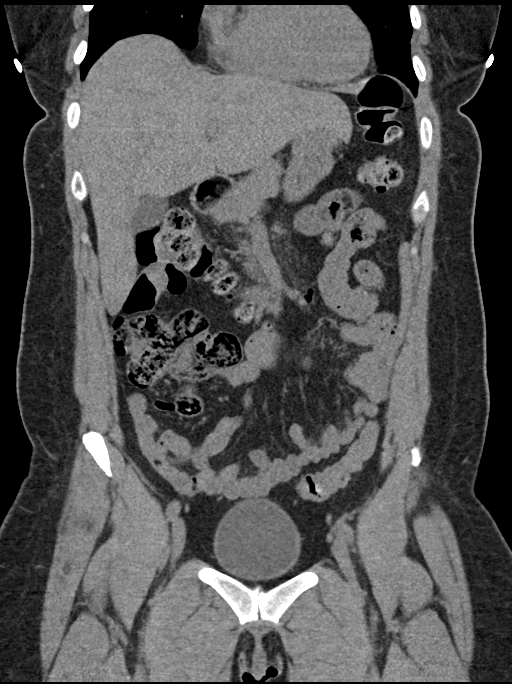
[im 59/132  soft-tissue]
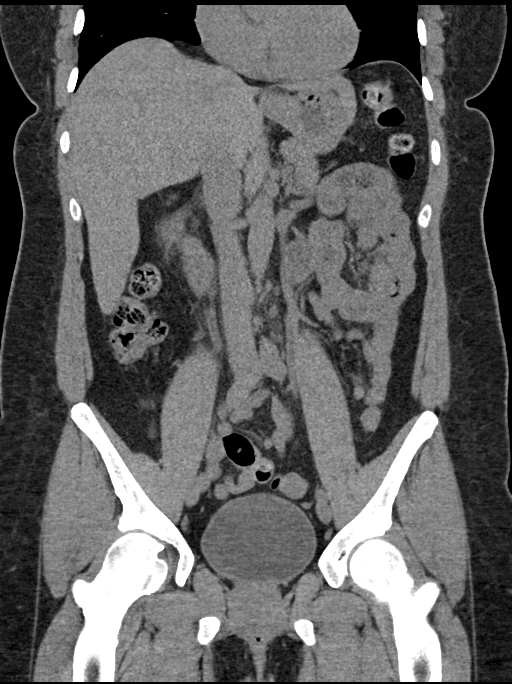
[im 73/132  soft-tissue]
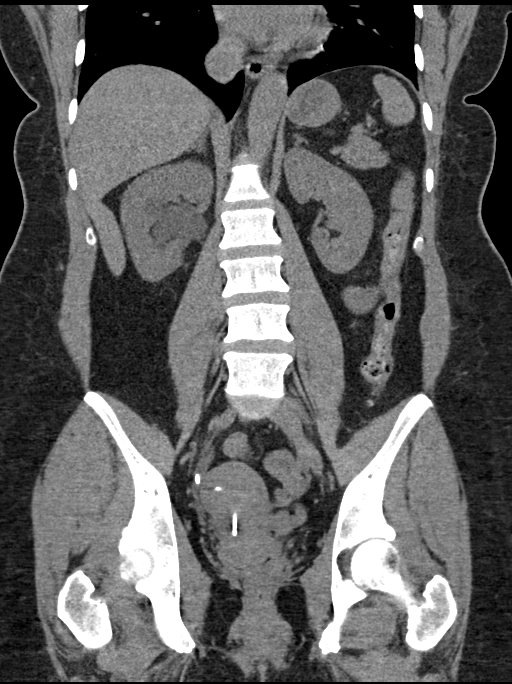

[16 of 46 positions shown; findings below may reference images not displayed]

FINDINGS: Lower chest: Minimal bibasilar atelectasis.  No acute abnormality.

Hepatobiliary: There is a 1.0 cm simple cyst in segment 4B. No other
focal liver abnormality. The gallbladder is unremarkable. No biliary
dilatation.

Pancreas: Unremarkable. No pancreatic ductal dilatation or
surrounding inflammatory changes.

Spleen: Normal in size without focal abnormality.

Adrenals/Urinary Tract: The adrenal glands are unremarkable. There
is a 7 mm calculus in the distal right ureter with resultant mild
right hydroureteronephrosis. There is mild right periureteral
stranding. Left kidney and bladder are unremarkable.

Stomach/Bowel: Stomach is within normal limits. Appendix appears
normal. No evidence of bowel wall thickening, distention, or
inflammatory changes. Scattered left-sided colonic diverticulosis.

Vascular/Lymphatic: No significant vascular findings are present. No
enlarged abdominal or pelvic lymph nodes.

Reproductive: IUD within the uterus.  No adnexal mass.

Other: No free fluid or pneumoperitoneum.

Musculoskeletal: No acute or significant osseous findings. Severe
bilateral facet arthropathy at L4-L5.
IMPRESSION: 1. 7 mm calculus in the distal right ureter with resultant mild
right hydroureteronephrosis.
2. Left-sided colonic diverticulosis.

## 2019-04-01 IMAGING — CR DG ABDOMEN 1V
2 series · 2 of 2 positions shown · non-contrast
Comparison: CT 06/11/2017

CLINICAL DATA: Pre-op for lithotripsy for right side stone.

EXAM:
ABDOMEN - 1 VIEW

[t abdomen supine (1 of 2)]
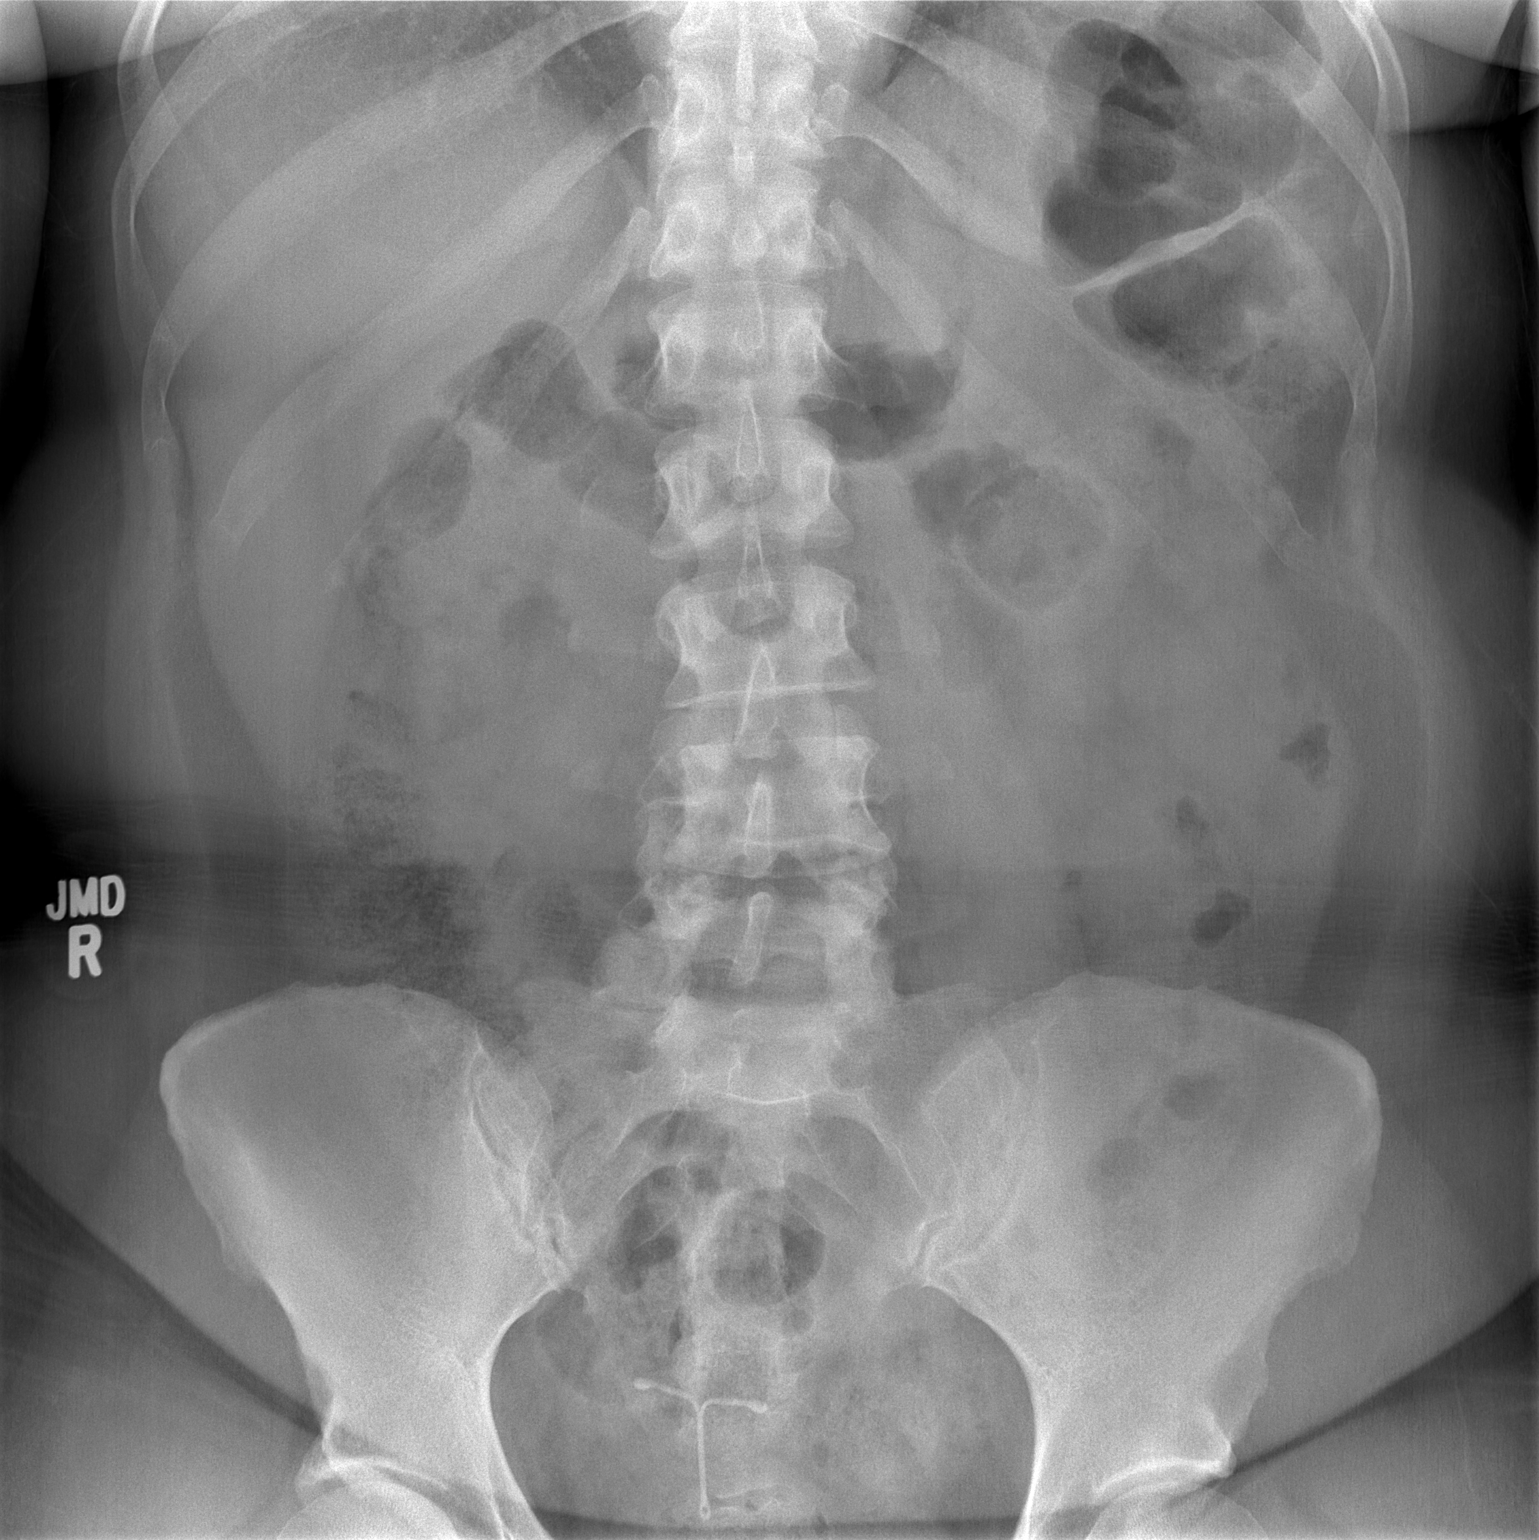

[t abdomen supine (2 of 2)]
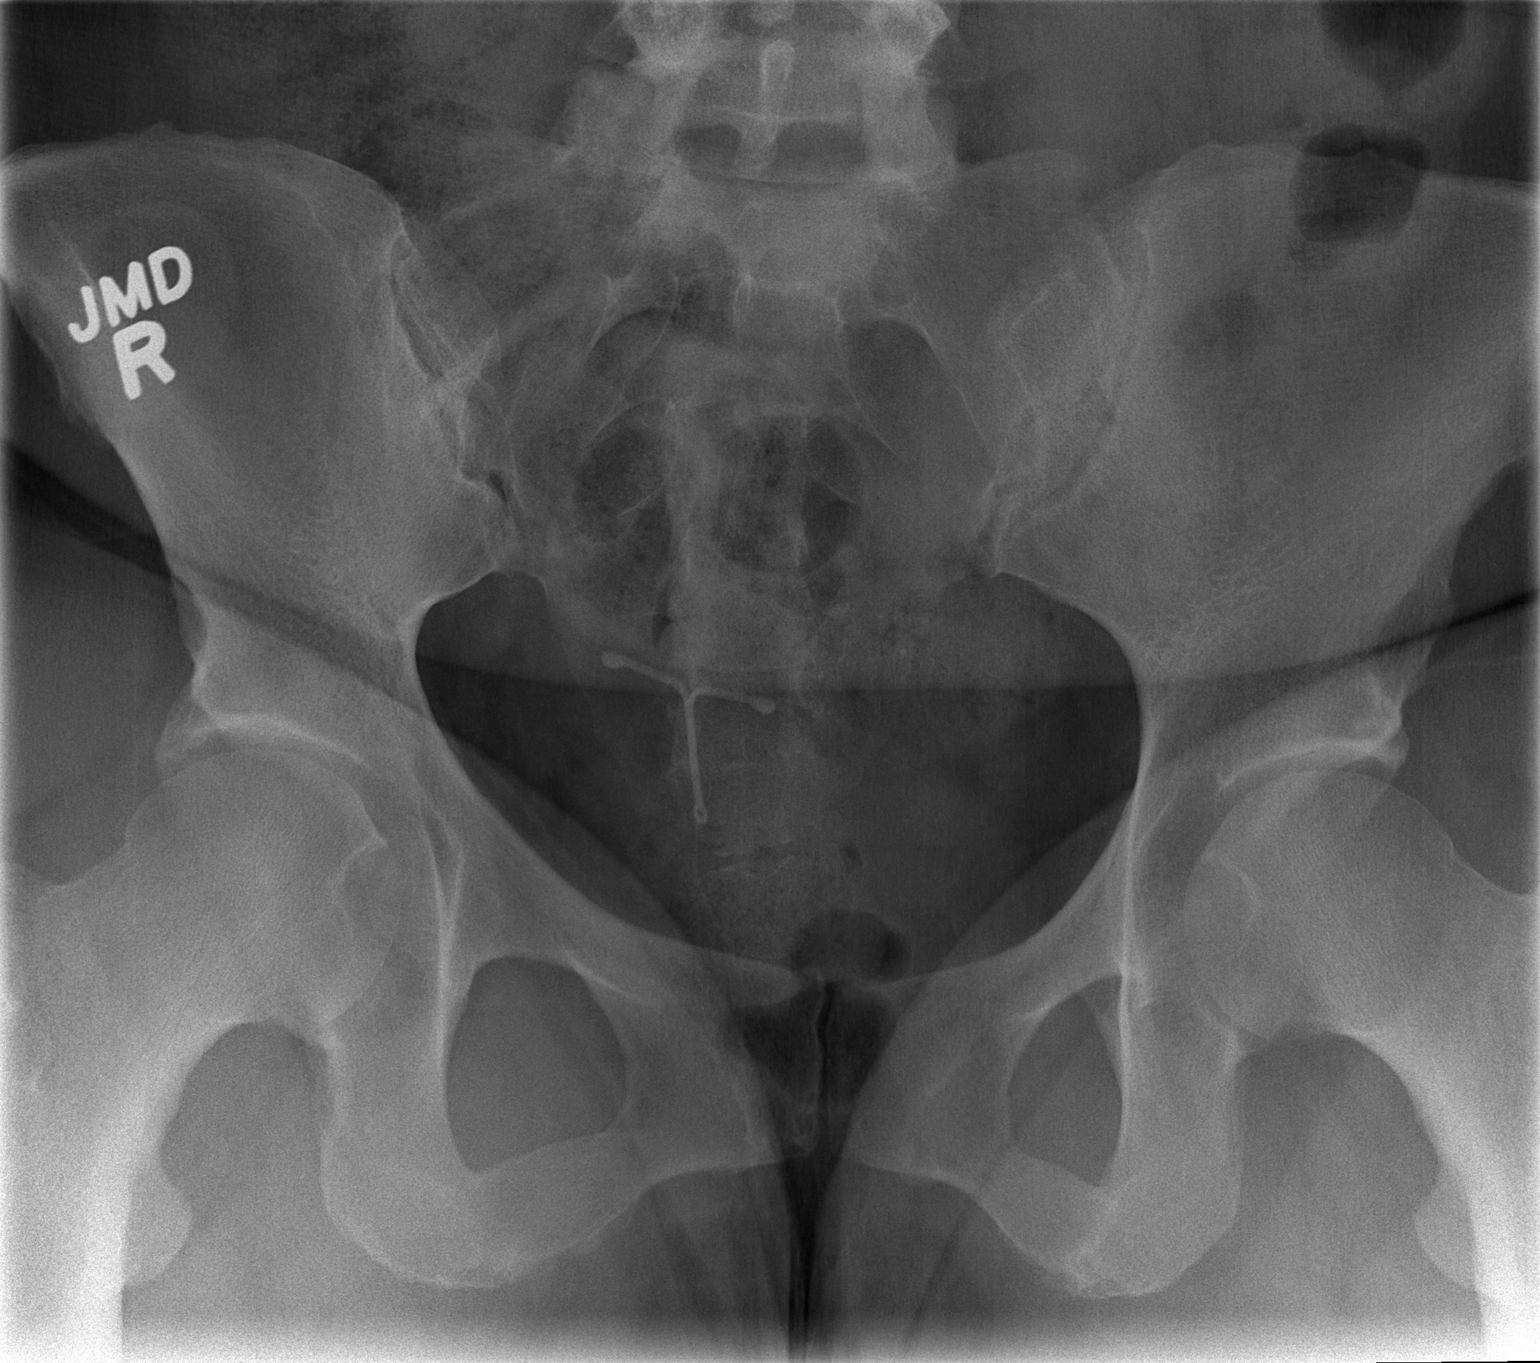

[2 of 2 positions shown; findings below may reference images not displayed]

FINDINGS: Distal RIGHT ureteral calculus is difficult to define but may
represented by 5 mm density inferior to the RIGHT sacroiliac joint.
No nephrolithiasis identified.

IUD in expected location
IMPRESSION: Suspected distal RIGHT ureteral calculus below the RIGHT SI joint

## 2019-04-11 LAB — HM MAMMOGRAPHY

## 2019-04-25 ENCOUNTER — Encounter: Payer: Self-pay | Admitting: Internal Medicine

## 2019-04-30 ENCOUNTER — Encounter: Payer: BC Managed Care – PPO | Admitting: Internal Medicine

## 2019-05-03 ENCOUNTER — Other Ambulatory Visit (INDEPENDENT_AMBULATORY_CARE_PROVIDER_SITE_OTHER): Payer: BC Managed Care – PPO

## 2019-05-03 ENCOUNTER — Encounter: Payer: Self-pay | Admitting: Internal Medicine

## 2019-05-03 ENCOUNTER — Other Ambulatory Visit: Payer: Self-pay

## 2019-05-03 ENCOUNTER — Ambulatory Visit (INDEPENDENT_AMBULATORY_CARE_PROVIDER_SITE_OTHER): Payer: BC Managed Care – PPO | Admitting: Internal Medicine

## 2019-05-03 VITALS — BP 140/90 | HR 104 | Temp 98.5°F | Ht 66.0 in | Wt 228.0 lb

## 2019-05-03 DIAGNOSIS — J3089 Other allergic rhinitis: Secondary | ICD-10-CM

## 2019-05-03 DIAGNOSIS — Z0000 Encounter for general adult medical examination without abnormal findings: Secondary | ICD-10-CM | POA: Diagnosis not present

## 2019-05-03 DIAGNOSIS — I10 Essential (primary) hypertension: Secondary | ICD-10-CM

## 2019-05-03 DIAGNOSIS — Z23 Encounter for immunization: Secondary | ICD-10-CM

## 2019-05-03 LAB — HEMOGLOBIN A1C: Hgb A1c MFr Bld: 5 % (ref 4.6–6.5)

## 2019-05-03 LAB — LIPID PANEL
Cholesterol: 220 mg/dL — ABNORMAL HIGH (ref 0–200)
HDL: 49.8 mg/dL (ref >39.00)
LDL Cholesterol: 153 mg/dL — ABNORMAL HIGH (ref 0–99)
NonHDL: 170.33
Total CHOL/HDL Ratio: 4
Triglycerides: 86 mg/dL (ref 0.0–149.0)
VLDL: 17.2 mg/dL (ref 0.0–40.0)

## 2019-05-03 LAB — COMPREHENSIVE METABOLIC PANEL
ALT: 16 U/L (ref 0–35)
AST: 14 U/L (ref 0–37)
Albumin: 4.5 g/dL (ref 3.5–5.2)
Alkaline Phosphatase: 70 U/L (ref 39–117)
BUN: 15 mg/dL (ref 6–23)
CO2: 28 mEq/L (ref 19–32)
Calcium: 9.7 mg/dL (ref 8.4–10.5)
Chloride: 101 mEq/L (ref 96–112)
Creatinine, Ser: 0.88 mg/dL (ref 0.40–1.20)
GFR: 82.18 mL/min (ref >60.00)
Glucose, Bld: 95 mg/dL (ref 70–99)
Potassium: 3.6 mEq/L (ref 3.5–5.1)
Sodium: 138 mEq/L (ref 135–145)
Total Bilirubin: 0.6 mg/dL (ref 0.2–1.2)
Total Protein: 8.4 g/dL — ABNORMAL HIGH (ref 6.0–8.3)

## 2019-05-03 LAB — CBC
HCT: 41.2 % (ref 36.0–46.0)
Hemoglobin: 14 g/dL (ref 12.0–15.0)
MCHC: 34 g/dL (ref 30.0–36.0)
MCV: 90.9 fl (ref 78.0–100.0)
Platelets: 442 10*3/uL — ABNORMAL HIGH (ref 150.0–400.0)
RBC: 4.53 Mil/uL (ref 3.87–5.11)
RDW: 13.1 % (ref 11.5–15.5)
WBC: 7.1 10*3/uL (ref 4.0–10.5)

## 2019-05-03 MED ORDER — HYDROCHLOROTHIAZIDE 25 MG PO TABS: 25.0000 mg | ORAL_TABLET | Freq: Every day | ORAL | 3 refills | Status: DC

## 2019-05-03 MED ORDER — FLUTICASONE PROPIONATE 50 MCG/ACT NA SUSP: 2.0000 | Freq: Every day | NASAL | 6 refills | Status: DC

## 2019-05-03 NOTE — Assessment & Plan Note (Signed)
Flu shot declines. Shingrix given 1st today. Tetanus up to date. Colonoscopy up to date. Mammogram with gyn, pap smear with gyn. Counseled about sun safety and mole surveillance. Counseled about the dangers of distracted driving. Given 10 year screening recommendations.

## 2019-05-03 NOTE — Progress Notes (Signed)
   Subjective:   Patient ID: [name], female    DOB: [birth year], 48 y.o.   MRN: [id number]  HPI The patient is a 48 YO female coming in for physical.   PMH, [place], social history reviewed and updated  Review of Systems  Constitutional: Negative.   HENT: Negative.   Eyes: Negative.   Respiratory: Negative for cough, chest tightness and shortness of breath.   Cardiovascular: Negative for chest pain, palpitations and leg swelling.  Gastrointestinal: Negative for abdominal distention, abdominal pain, constipation, diarrhea, nausea and vomiting.  Musculoskeletal: Negative.   Skin: Negative.   Neurological: Negative.   Psychiatric/Behavioral: Negative.     Objective:  Physical Exam Constitutional:      Appearance: She is well-developed. She is obese.  HENT:     Head: Normocephalic and atraumatic.  Neck:     Musculoskeletal: Normal range of motion.  Cardiovascular:     Rate and Rhythm: Normal rate and regular rhythm.  Pulmonary:     Effort: Pulmonary effort is normal. No respiratory distress.     Breath sounds: Normal breath sounds. No wheezing or rales.  Abdominal:     General: Bowel sounds are normal. There is no distension.     Palpations: Abdomen is soft.     Tenderness: There is no abdominal tenderness. There is no rebound.  Skin:    General: Skin is warm and dry.  Neurological:     Mental Status: She is alert and oriented to person, place, and time.     Coordination: Coordination normal.     Vitals:   05/03/19 0851  BP: 140/90  Pulse: (!) 104  Temp: 98.5 F (36.9 C)  TempSrc: Oral  SpO2: 98%  Weight: 228 lb (103.4 kg)  Height: 5\' 6"  (1.676 m)    This visit occurred during the SARS-CoV-2 public health emergency.  Safety protocols were in place, including screening questions prior to the visit, additional usage of staff PPE, and extensive cleaning of exam room while observing appropriate contact time as indicated for disinfecting solutions.    Assessment & Plan:  Shingrix IM given at visit

## 2019-05-03 NOTE — Assessment & Plan Note (Signed)
Refill flonase

## 2019-05-03 NOTE — Patient Instructions (Signed)
Health Maintenance, Female Adopting a healthy lifestyle and getting preventive care are important in promoting health and wellness. Ask your health care provider about:  The right schedule for you to have regular tests and exams.  Things you can do on your own to prevent diseases and keep yourself healthy. What should I know about diet, weight, and exercise? Eat a healthy diet   Eat a diet that includes plenty of vegetables, fruits, low-fat dairy products, and lean protein.  Do not eat a lot of foods that are high in solid fats, added sugars, or sodium. Maintain a healthy weight Body mass index (BMI) is used to identify weight problems. It estimates body fat based on height and weight. Your health care provider can help determine your BMI and help you achieve or maintain a healthy weight. Get regular exercise Get regular exercise. This is one of the most important things you can do for your health. Most adults should:  Exercise for at least 150 minutes each week. The exercise should increase your heart rate and make you sweat (moderate-intensity exercise).  Do strengthening exercises at least twice a week. This is in addition to the moderate-intensity exercise.  Spend less time sitting. Even light physical activity can be beneficial. Watch cholesterol and blood lipids Have your blood tested for lipids and cholesterol at 50 years of age, then have this test every 5 years. Have your cholesterol levels checked more often if:  Your lipid or cholesterol levels are high.  You are older than 50 years of age.  You are at high risk for heart disease. What should I know about cancer screening? Depending on your health history and family history, you may need to have cancer screening at various ages. This may include screening for:  Breast cancer.  Cervical cancer.  Colorectal cancer.  Skin cancer.  Lung cancer. What should I know about heart disease, diabetes, and high blood  pressure? Blood pressure and heart disease  High blood pressure causes heart disease and increases the risk of stroke. This is more likely to develop in people who have high blood pressure readings, are of African descent, or are overweight.  Have your blood pressure checked: ? Every 3-5 years if you are 18-39 years of age. ? Every year if you are 40 years old or older. Diabetes Have regular diabetes screenings. This checks your fasting blood sugar level. Have the screening done:  Once every three years after age 40 if you are at a normal weight and have a low risk for diabetes.  More often and at a younger age if you are overweight or have a high risk for diabetes. What should I know about preventing infection? Hepatitis B If you have a higher risk for hepatitis B, you should be screened for this virus. Talk with your health care provider to find out if you are at risk for hepatitis B infection. Hepatitis C Testing is recommended for:  Everyone born from 1945 through 1965.  Anyone with known risk factors for hepatitis C. Sexually transmitted infections (STIs)  Get screened for STIs, including gonorrhea and chlamydia, if: ? You are sexually active and are younger than 50 years of age. ? You are older than 50 years of age and your health care provider tells you that you are at risk for this type of infection. ? Your sexual activity has changed since you were last screened, and you are at increased risk for chlamydia or gonorrhea. Ask your health care provider if   you are at risk.  Ask your health care provider about whether you are at high risk for HIV. Your health care provider may recommend a prescription medicine to help prevent HIV infection. If you choose to take medicine to prevent HIV, you should first get tested for HIV. You should then be tested every 3 months for as long as you are taking the medicine. Pregnancy  If you are about to stop having your period (premenopausal) and  you may become pregnant, seek counseling before you get pregnant.  Take 400 to 800 micrograms (mcg) of folic acid every day if you become pregnant.  Ask for birth control (contraception) if you want to prevent pregnancy. Osteoporosis and menopause Osteoporosis is a disease in which the bones lose minerals and strength with aging. This can result in bone fractures. If you are 65 years old or older, or if you are at risk for osteoporosis and fractures, ask your health care provider if you should:  Be screened for bone loss.  Take a calcium or vitamin D supplement to lower your risk of fractures.  Be given hormone replacement therapy (HRT) to treat symptoms of menopause. Follow these instructions at home: Lifestyle  Do not use any products that contain nicotine or tobacco, such as cigarettes, e-cigarettes, and chewing tobacco. If you need help quitting, ask your health care provider.  Do not use street drugs.  Do not share needles.  Ask your health care provider for help if you need support or information about quitting drugs. Alcohol use  Do not drink alcohol if: ? Your health care provider tells you not to drink. ? You are pregnant, may be pregnant, or are planning to become pregnant.  If you drink alcohol: ? Limit how much you use to 0-1 drink a day. ? Limit intake if you are breastfeeding.  Be aware of how much alcohol is in your drink. In the [place] one drink equals one 12 oz bottle of beer (355 mL), one 5 oz glass of wine (148 mL), or one 1 oz glass of hard liquor (44 mL). General instructions  Schedule regular health, dental, and eye exams.  Stay current with your vaccines.  Tell your health care provider if: ? You often feel depressed. ? You have ever been abused or do not feel safe at home. Summary  Adopting a healthy lifestyle and getting preventive care are important in promoting health and wellness.  Follow your health care provider's instructions about healthy  diet, exercising, and getting tested or screened for diseases.  Follow your health care provider's instructions on monitoring your cholesterol and blood pressure. This information is not intended to replace advice given to you by your health care provider. Make sure you discuss any questions you have with your health care provider. Document Released: 12/13/2010 Document Revised: 05/23/2018 Document Reviewed: 05/23/2018 Elsevier Patient Education  2020 [employer]vier Inc.  

## 2019-05-03 NOTE — Assessment & Plan Note (Signed)
Wants to switch BP medication due to persistent foot/ankle swelling. Will switch to hctz 25 mg daily. Checking CMP.

## 2019-05-06 ENCOUNTER — Encounter: Payer: Self-pay | Admitting: Internal Medicine

## 2019-05-06 NOTE — Progress Notes (Signed)
Abstracted and sent to scan  

## 2019-07-01 ENCOUNTER — Ambulatory Visit (INDEPENDENT_AMBULATORY_CARE_PROVIDER_SITE_OTHER): Payer: BC Managed Care – PPO | Admitting: *Deleted

## 2019-07-01 ENCOUNTER — Other Ambulatory Visit: Payer: Self-pay

## 2019-07-01 DIAGNOSIS — Z23 Encounter for immunization: Secondary | ICD-10-CM | POA: Diagnosis not present

## 2019-08-08 ENCOUNTER — Ambulatory Visit: Payer: BC Managed Care – PPO | Attending: Internal Medicine

## 2019-08-08 DIAGNOSIS — Z23 Encounter for immunization: Secondary | ICD-10-CM

## 2019-08-08 NOTE — Progress Notes (Signed)
   Covid-19 Vaccination Clinic  Name:  [name]    MRN: [id number] DOB: 09/25/1970  08/08/2019  [name] was observed post Covid-19 immunization for 15 minutes without incidence. She was provided with Vaccine Information Sheet and instruction to access the V-Safe system.   [name] was instructed to call 911 with any severe reactions post vaccine: Marland Kitchen Difficulty breathing  . Swelling of your face and throat  . A fast heartbeat  . A bad rash all over your body  . Dizziness and weakness    Immunizations Administered    Name Date Dose VIS Date Route   Pfizer COVID-19 Vaccine 08/08/2019 12:31 PM 0.3 mL 05/24/2019 Intramuscular   Manufacturer: [employer], [employer]   Lot: [id number]   NDC: [address]-2

## 2019-09-03 ENCOUNTER — Ambulatory Visit: Payer: BC Managed Care – PPO | Attending: Internal Medicine

## 2019-09-03 DIAGNOSIS — Z23 Encounter for immunization: Secondary | ICD-10-CM

## 2019-09-03 NOTE — Progress Notes (Signed)
   Covid-19 Vaccination Clinic  Name:  [name]    MRN: [id number] DOB: 1970-11-24  09/03/2019  Ms. [name] was observed post Covid-19 immunization for 15 minutes without incident. She was provided with Vaccine Information Sheet and instruction to access the V-Safe system.   [name] was instructed to call 911 with any severe reactions post vaccine: Marland Kitchen Difficulty breathing  . Swelling of face and throat  . A fast heartbeat  . A bad rash all over body  . Dizziness and weakness   Immunizations Administered    Name Date Dose VIS Date Route   Pfizer COVID-19 Vaccine 09/03/2019 12:20 PM 0.3 mL 05/24/2019 Intramuscular   Manufacturer: [employer], [employer]   Lot: [id number]   NDC: [address]-5

## 2020-05-04 ENCOUNTER — Other Ambulatory Visit: Payer: Self-pay

## 2020-05-04 ENCOUNTER — Encounter: Payer: Self-pay | Admitting: Internal Medicine

## 2020-05-04 ENCOUNTER — Ambulatory Visit (INDEPENDENT_AMBULATORY_CARE_PROVIDER_SITE_OTHER): Payer: BC Managed Care – PPO | Admitting: Internal Medicine

## 2020-05-04 VITALS — BP 142/82 | HR 93 | Temp 98.9°F | Ht 66.0 in | Wt 241.0 lb

## 2020-05-04 DIAGNOSIS — J3089 Other allergic rhinitis: Secondary | ICD-10-CM

## 2020-05-04 DIAGNOSIS — Z0000 Encounter for general adult medical examination without abnormal findings: Secondary | ICD-10-CM

## 2020-05-04 DIAGNOSIS — E66812 Obesity, class 2: Secondary | ICD-10-CM

## 2020-05-04 DIAGNOSIS — Z6838 Body mass index (BMI) 38.0-38.9, adult: Secondary | ICD-10-CM

## 2020-05-04 DIAGNOSIS — I10 Essential (primary) hypertension: Secondary | ICD-10-CM | POA: Diagnosis not present

## 2020-05-04 DIAGNOSIS — E6609 Other obesity due to excess calories: Secondary | ICD-10-CM

## 2020-05-04 LAB — COMPREHENSIVE METABOLIC PANEL
ALT: 12 U/L (ref 0–35)
AST: 14 U/L (ref 0–37)
Albumin: 4.3 g/dL (ref 3.5–5.2)
Alkaline Phosphatase: 63 U/L (ref 39–117)
BUN: 16 mg/dL (ref 6–23)
CO2: 30 mEq/L (ref 19–32)
Calcium: 9.3 mg/dL (ref 8.4–10.5)
Chloride: 101 mEq/L (ref 96–112)
Creatinine, Ser: 1.02 mg/dL (ref 0.40–1.20)
GFR: 63.77 mL/min (ref >60.00)
Glucose, Bld: 92 mg/dL (ref 70–99)
Potassium: 3.9 mEq/L (ref 3.5–5.1)
Sodium: 138 mEq/L (ref 135–145)
Total Bilirubin: 0.6 mg/dL (ref 0.2–1.2)
Total Protein: 8 g/dL (ref 6.0–8.3)

## 2020-05-04 LAB — CBC
HCT: 40.4 % (ref 36.0–46.0)
Hemoglobin: 13.7 g/dL (ref 12.0–15.0)
MCHC: 34 g/dL (ref 30.0–36.0)
MCV: 89.1 fl (ref 78.0–100.0)
Platelets: 414 10*3/uL — ABNORMAL HIGH (ref 150.0–400.0)
RBC: 4.54 Mil/uL (ref 3.87–5.11)
RDW: 13.1 % (ref 11.5–15.5)
WBC: 5.3 10*3/uL (ref 4.0–10.5)

## 2020-05-04 LAB — LIPID PANEL
Cholesterol: 190 mg/dL (ref 0–200)
HDL: 42.2 mg/dL (ref >39.00)
LDL Cholesterol: 133 mg/dL — ABNORMAL HIGH (ref 0–99)
NonHDL: 147.83
Total CHOL/HDL Ratio: 5
Triglycerides: 76 mg/dL (ref 0.0–149.0)
VLDL: 15.2 mg/dL (ref 0.0–40.0)

## 2020-05-04 LAB — HEMOGLOBIN A1C: Hgb A1c MFr Bld: 5 % (ref 4.6–6.5)

## 2020-05-04 MED ORDER — HYDROCHLOROTHIAZIDE 25 MG PO TABS
25.0000 mg | ORAL_TABLET | Freq: Every day | ORAL | 3 refills | Status: DC
Start: 2020-05-04 — End: 2021-05-05

## 2020-05-04 MED ORDER — MONTELUKAST SODIUM 10 MG PO TABS
10.0000 mg | ORAL_TABLET | Freq: Every day | ORAL | 3 refills | Status: DC
Start: 2020-05-04 — End: 2021-05-05

## 2020-05-04 MED ORDER — FLUTICASONE PROPIONATE 50 MCG/ACT NA SUSP: 2.0000 | Freq: Every day | NASAL | 6 refills | Status: DC

## 2020-05-04 NOTE — Progress Notes (Signed)
   Subjective:   Patient ID: [name], female    DOB: 17-Dec-1970, 49 y.o.   MRN: [id number]  HPI The patient is a 49 YO female coming in for physical.   PMH, FMH, social history reviewed and updated  Review of Systems  Constitutional: Negative.   HENT: Negative.   Eyes: Negative.   Respiratory: Negative for cough, chest tightness and shortness of breath.   Cardiovascular: Negative for chest pain, palpitations and leg swelling.  Gastrointestinal: Negative for abdominal distention, abdominal pain, constipation, diarrhea, nausea and vomiting.  Musculoskeletal: Negative.   Skin: Negative.   Neurological: Negative.   Psychiatric/Behavioral: Negative.     Objective:  Physical Exam Constitutional:      Appearance: She is well-developed. She is obese.  HENT:     Head: Normocephalic and atraumatic.  Cardiovascular:     Rate and Rhythm: Normal rate and regular rhythm.  Pulmonary:     Effort: Pulmonary effort is normal. No respiratory distress.     Breath sounds: Normal breath sounds. No wheezing or rales.  Abdominal:     General: Bowel sounds are normal. There is no distension.     Palpations: Abdomen is soft.     Tenderness: There is no abdominal tenderness. There is no rebound.  Musculoskeletal:     Cervical back: Normal range of motion.  Skin:    General: Skin is warm and dry.  Neurological:     Mental Status: She is alert and oriented to person, place, and time.     Coordination: Coordination normal.     Vitals:   05/04/20 0849  BP: (!) 142/82  Pulse: 93  Temp: 98.9 F (37.2 C)  TempSrc: Oral  SpO2: 97%  Weight: 241 lb (109.3 kg)  Height: 5\' 6"  (1.676 m)    This visit occurred during the SARS-CoV-2 public health emergency.  Safety protocols were in place, including screening questions prior to the visit, additional usage of staff PPE, and extensive cleaning of exam room while observing appropriate contact time as indicated for disinfecting solutions.    Assessment & Plan:

## 2020-05-04 NOTE — Assessment & Plan Note (Signed)
Weight up some from last year. Working with trainer to help with fitness.

## 2020-05-04 NOTE — Assessment & Plan Note (Signed)
Flonase and singulair prn. Refilled today.

## 2020-05-04 NOTE — Assessment & Plan Note (Signed)
Flu shot declines. Covid-19 up to date including booster. Shingrix complete. Tetanus due 2022. Colonoscopy due 2030. Mammogram up to date with gyn, pap smear up to date with gyn. Counseled about sun safety and mole surveillance. Counseled about the dangers of distracted driving. Given 10 year screening recommendations.

## 2020-05-04 NOTE — Assessment & Plan Note (Signed)
BP at goal on hctz and checking CMP and adjust as needed.  

## 2020-05-04 NOTE — Patient Instructions (Signed)
Health Maintenance, Female Adopting a healthy lifestyle and getting preventive care are important in promoting health and wellness. Ask your health care provider about:  The right schedule for you to have regular tests and exams.  Things you can do on your own to prevent diseases and keep yourself healthy. What should I know about diet, weight, and exercise? Eat a healthy diet   Eat a diet that includes plenty of vegetables, fruits, low-fat dairy products, and lean protein.  Do not eat a lot of foods that are high in solid fats, added sugars, or sodium. Maintain a healthy weight Body mass index (BMI) is used to identify weight problems. It estimates body fat based on height and weight. Your health care provider can help determine your BMI and help you achieve or maintain a healthy weight. Get regular exercise Get regular exercise. This is one of the most important things you can do for your health. Most adults should:  Exercise for at least 150 minutes each week. The exercise should increase your heart rate and make you sweat (moderate-intensity exercise).  Do strengthening exercises at least twice a week. This is in addition to the moderate-intensity exercise.  Spend less time sitting. Even light physical activity can be beneficial. Watch cholesterol and blood lipids Have your blood tested for lipids and cholesterol at 51 years of age, then have this test every 5 years. Have your cholesterol levels checked more often if:  Your lipid or cholesterol levels are high.  You are older than 51 years of age.  You are at high risk for heart disease. What should I know about cancer screening? Depending on your health history and family history, you may need to have cancer screening at various ages. This may include screening for:  Breast cancer.  Cervical cancer.  Colorectal cancer.  Skin cancer.  Lung cancer. What should I know about heart disease, diabetes, and high blood  pressure? Blood pressure and heart disease  High blood pressure causes heart disease and increases the risk of stroke. This is more likely to develop in people who have high blood pressure readings, are of African descent, or are overweight.  Have your blood pressure checked: ? Every 3-5 years if you are 18-39 years of age. ? Every year if you are 40 years old or older. Diabetes Have regular diabetes screenings. This checks your fasting blood sugar level. Have the screening done:  Once every three years after age 40 if you are at a normal weight and have a low risk for diabetes.  More often and at a younger age if you are overweight or have a high risk for diabetes. What should I know about preventing infection? Hepatitis B If you have a higher risk for hepatitis B, you should be screened for this virus. Talk with your health care provider to find out if you are at risk for hepatitis B infection. Hepatitis C Testing is recommended for:  Everyone born from 1945 through 1965.  Anyone with known risk factors for hepatitis C. Sexually transmitted infections (STIs)  Get screened for STIs, including gonorrhea and chlamydia, if: ? You are sexually active and are younger than 51 years of age. ? You are older than 51 years of age and your health care provider tells you that you are at risk for this type of infection. ? Your sexual activity has changed since you were last screened, and you are at increased risk for chlamydia or gonorrhea. Ask your health care provider if   you are at risk.  Ask your health care provider about whether you are at high risk for HIV. Your health care provider may recommend a prescription medicine to help prevent HIV infection. If you choose to take medicine to prevent HIV, you should first get tested for HIV. You should then be tested every 3 months for as long as you are taking the medicine. Pregnancy  If you are about to stop having your period (premenopausal) and  you may become pregnant, seek counseling before you get pregnant.  Take 400 to 800 micrograms (mcg) of folic acid every day if you become pregnant.  Ask for birth control (contraception) if you want to prevent pregnancy. Osteoporosis and menopause Osteoporosis is a disease in which the bones lose minerals and strength with aging. This can result in bone fractures. If you are 65 years old or older, or if you are at risk for osteoporosis and fractures, ask your health care provider if you should:  Be screened for bone loss.  Take a calcium or vitamin D supplement to lower your risk of fractures.  Be given hormone replacement therapy (HRT) to treat symptoms of menopause. Follow these instructions at home: Lifestyle  Do not use any products that contain nicotine or tobacco, such as cigarettes, e-cigarettes, and chewing tobacco. If you need help quitting, ask your health care provider.  Do not use street drugs.  Do not share needles.  Ask your health care provider for help if you need support or information about quitting drugs. Alcohol use  Do not drink alcohol if: ? Your health care provider tells you not to drink. ? You are pregnant, may be pregnant, or are planning to become pregnant.  If you drink alcohol: ? Limit how much you use to 0-1 drink a day. ? Limit intake if you are breastfeeding.  Be aware of how much alcohol is in your drink. In the [place] one drink equals one 12 oz bottle of beer (355 mL), one 5 oz glass of wine (148 mL), or one 1 oz glass of hard liquor (44 mL). General instructions  Schedule regular health, dental, and eye exams.  Stay current with your vaccines.  Tell your health care provider if: ? You often feel depressed. ? You have ever been abused or do not feel safe at home. Summary  Adopting a healthy lifestyle and getting preventive care are important in promoting health and wellness.  Follow your health care provider's instructions about healthy  diet, exercising, and getting tested or screened for diseases.  Follow your health care provider's instructions on monitoring your cholesterol and blood pressure. This information is not intended to replace advice given to you by your health care provider. Make sure you discuss any questions you have with your health care provider. Document Revised: 05/23/2018 Document Reviewed: 05/23/2018 Elsevier Patient Education  2020 Elsevier Inc.  

## 2020-12-31 ENCOUNTER — Telehealth: Payer: BC Managed Care – PPO | Admitting: Physician Assistant

## 2020-12-31 DIAGNOSIS — A084 Viral intestinal infection, unspecified: Secondary | ICD-10-CM

## 2020-12-31 MED ORDER — ONDANSETRON 4 MG PO TBDP: 4.0000 mg | ORAL_TABLET | Freq: Three times a day (TID) | ORAL | 0 refills | Status: DC | PRN

## 2020-12-31 NOTE — Progress Notes (Signed)
E-Visit for Nausea and Vomiting   We are sorry that you are not feeling well. Here is how we plan to help!  Based on what you have shared with me it looks like you have a Virus that is irritating your GI tract.  Vomiting is the forceful emptying of a portion of the stomach's content through the mouth.  Although nausea and vomiting can make you feel miserable, it's important to remember that these are not diseases, but rather symptoms of an underlying illness.  When we treat short term symptoms, we always caution that any symptoms that persist should be fully evaluated in a medical office.  I have prescribed a medication that will help alleviate your symptoms and allow you to stay hydrated:  Zofran 4 mg 1 tablet every 8 hours as needed for nausea and vomiting  HOME CARE: Drink clear liquids.  This is very important! Dehydration (the lack of fluid) can lead to a serious complication.  Start off with 1 tablespoon every 5 minutes for 8 hours. You may begin eating bland foods after 8 hours without vomiting.  Start with saltine crackers, white bread, rice, mashed potatoes, applesauce. After 48 hours on a bland diet, you may resume a normal diet. Try to go to sleep.  Sleep often empties the stomach and relieves the need to vomit.  GET HELP RIGHT AWAY IF:  Your symptoms do not improve or worsen within 2 days after treatment. You have a fever for over 3 days. You cannot keep down fluids after trying the medication.  MAKE SURE YOU:  Understand these instructions. Will watch your condition. Will get help right away if you are not doing well or get worse.    Thank you for choosing an e-visit.  Your e-visit answers were reviewed by a board certified advanced clinical practitioner to complete your personal care plan. Depending upon the condition, your plan could have included both over the counter or prescription medications.  Please review your pharmacy choice. Make sure the pharmacy is open so  you can pick up prescription now. If there is a problem, you may contact your provider through MyChart messaging and have the prescription routed to another pharmacy.  Your safety is important to us. If you have drug allergies check your prescription carefully.   For the next 24 hours you can use MyChart to ask questions about today's visit, request a non-urgent call back, or ask for a work or school excuse. You will get an email in the next two days asking about your experience. I hope that your e-visit has been valuable and will speed your recovery.  

## 2020-12-31 NOTE — Progress Notes (Signed)
I have spent 5 minutes in review of e-visit questionnaire, review and updating patient chart, medical decision making and response to patient.   Sreeja Spies Cody Coury Grieger, PA-C    

## 2021-05-05 ENCOUNTER — Encounter: Payer: Self-pay | Admitting: Internal Medicine

## 2021-05-05 ENCOUNTER — Ambulatory Visit (INDEPENDENT_AMBULATORY_CARE_PROVIDER_SITE_OTHER): Payer: BC Managed Care – PPO | Admitting: Internal Medicine

## 2021-05-05 ENCOUNTER — Other Ambulatory Visit: Payer: Self-pay

## 2021-05-05 VITALS — BP 138/88 | HR 84 | Temp 98.6°F | Ht 66.0 in | Wt 242.0 lb

## 2021-05-05 DIAGNOSIS — Z6839 Body mass index (BMI) 39.0-39.9, adult: Secondary | ICD-10-CM

## 2021-05-05 DIAGNOSIS — I10 Essential (primary) hypertension: Secondary | ICD-10-CM | POA: Diagnosis not present

## 2021-05-05 DIAGNOSIS — Z0000 Encounter for general adult medical examination without abnormal findings: Secondary | ICD-10-CM | POA: Diagnosis not present

## 2021-05-05 DIAGNOSIS — E6609 Other obesity due to excess calories: Secondary | ICD-10-CM

## 2021-05-05 DIAGNOSIS — J3089 Other allergic rhinitis: Secondary | ICD-10-CM

## 2021-05-05 DIAGNOSIS — Z23 Encounter for immunization: Secondary | ICD-10-CM

## 2021-05-05 LAB — LIPID PANEL
Cholesterol: 212 mg/dL — ABNORMAL HIGH (ref 0–200)
HDL: 43 mg/dL (ref >39.00)
LDL Cholesterol: 149 mg/dL — ABNORMAL HIGH (ref 0–99)
NonHDL: 169.39
Total CHOL/HDL Ratio: 5
Triglycerides: 100 mg/dL (ref 0.0–149.0)
VLDL: 20 mg/dL (ref 0.0–40.0)

## 2021-05-05 LAB — CBC
HCT: 40 % (ref 36.0–46.0)
Hemoglobin: 13.4 g/dL (ref 12.0–15.0)
MCHC: 33.6 g/dL (ref 30.0–36.0)
MCV: 89.5 fl (ref 78.0–100.0)
Platelets: 390 10*3/uL (ref 150.0–400.0)
RBC: 4.46 Mil/uL (ref 3.87–5.11)
RDW: 13.4 % (ref 11.5–15.5)
WBC: 5.6 10*3/uL (ref 4.0–10.5)

## 2021-05-05 LAB — COMPREHENSIVE METABOLIC PANEL
ALT: 13 U/L (ref 0–35)
AST: 17 U/L (ref 0–37)
Albumin: 4.2 g/dL (ref 3.5–5.2)
Alkaline Phosphatase: 61 U/L (ref 39–117)
BUN: 18 mg/dL (ref 6–23)
CO2: 29 mEq/L (ref 19–32)
Calcium: 9.2 mg/dL (ref 8.4–10.5)
Chloride: 101 mEq/L (ref 96–112)
Creatinine, Ser: 1.06 mg/dL (ref 0.40–1.20)
GFR: 60.46 mL/min (ref >60.00)
Glucose, Bld: 96 mg/dL (ref 70–99)
Potassium: 3.4 mEq/L — ABNORMAL LOW (ref 3.5–5.1)
Sodium: 137 mEq/L (ref 135–145)
Total Bilirubin: 0.7 mg/dL (ref 0.2–1.2)
Total Protein: 7.9 g/dL (ref 6.0–8.3)

## 2021-05-05 LAB — HEMOGLOBIN A1C: Hgb A1c MFr Bld: 5 % (ref 4.6–6.5)

## 2021-05-05 MED ORDER — HYDROCHLOROTHIAZIDE 25 MG PO TABS: 25.0000 mg | ORAL_TABLET | Freq: Every day | ORAL | 3 refills | Status: DC

## 2021-05-05 NOTE — Progress Notes (Signed)
   Subjective:   Patient ID: [name], female    DOB: 1970-11-15, 50 y.o.   MRN: [id number]  HPI The patient is a 50 YO female coming in for physical. 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  PMH, [facility], social history reviewed and updated  Review of Systems  Constitutional: Negative.   HENT: Negative.    Eyes: Negative.   Respiratory:  Negative for cough, chest tightness and shortness of breath.   Cardiovascular:  Negative for chest pain, palpitations and leg swelling.  Gastrointestinal:  Negative for abdominal distention, abdominal pain, constipation, diarrhea, nausea and vomiting.  Musculoskeletal: Negative.   Skin: Negative.   Neurological: Negative.   Psychiatric/Behavioral: Negative.     Objective:  Physical Exam Constitutional:      Appearance: She is well-developed.  HENT:     Head: Normocephalic and atraumatic.  Cardiovascular:     Rate and Rhythm: Normal rate and regular rhythm.  Pulmonary:     Effort: Pulmonary effort is normal. No respiratory distress.     Breath sounds: Normal breath sounds. No wheezing or rales.  Abdominal:     General: Bowel sounds are normal. There is no distension.     Palpations: Abdomen is soft.     Tenderness: There is no abdominal tenderness. There is no rebound.  Musculoskeletal:     Cervical back: Normal range of motion.  Skin:    General: Skin is warm and dry.  Neurological:     Mental Status: She is alert and oriented to person, place, and time.     Coordination: Coordination normal.    Vitals:   05/05/21 0901  BP: 138/88  Pulse: 84  Temp: 98.6 F (37 C)  TempSrc: Oral  SpO2: 97%  Weight: 242 lb (109.8 kg)  Height: 5\' 6"  (1.676 m)    This visit occurred during the SARS-CoV-2 public health emergency.  Safety protocols were in place, including screening questions prior to the visit, additional usage of staff PPE, and extensive cleaning of exam room while observing appropriate contact time as indicated for  disinfecting solutions.   Assessment & Plan:  Flu shot given at visit

## 2021-05-05 NOTE — Assessment & Plan Note (Signed)
Uses flonase and otc and can refill as needed. Overall controlled.

## 2021-05-05 NOTE — Assessment & Plan Note (Signed)
BP initially elevated but recheck normal. Have asked her to check 1-2 times weekly for 1 month and let us know. Taking hctz 25 mg daily. Checking CMP and adjust as needed.

## 2021-05-05 NOTE — Assessment & Plan Note (Signed)
Counseled about weight and she is working on adding more exercise. 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

## 2021-05-05 NOTE — Assessment & Plan Note (Signed)
Flu shot given. Covid-19 booster counseled. Shingrix complete. Tetanus declines. Colonoscopy up to date. Mammogram up to date, pap smear up to date. Counseled about sun safety and mole surveillance. Counseled about the dangers of distracted driving. Given 10 year screening recommendations.

## 2021-08-14 ENCOUNTER — Telehealth: Payer: BC Managed Care – PPO | Admitting: Emergency Medicine

## 2021-08-14 ENCOUNTER — Encounter: Payer: Self-pay | Admitting: Emergency Medicine

## 2021-08-14 DIAGNOSIS — R350 Frequency of micturition: Secondary | ICD-10-CM

## 2021-08-14 MED ORDER — NITROFURANTOIN MONOHYD MACRO 100 MG PO CAPS: 100.0000 mg | ORAL_CAPSULE | Freq: Two times a day (BID) | ORAL | 0 refills | Status: AC

## 2021-08-14 NOTE — Progress Notes (Signed)

## 2021-08-14 NOTE — Progress Notes (Signed)
I have spent 5 minutes in review of e-visit questionnaire, review and updating patient chart, medical decision making and response to patient.   Josede Cicero, PA-C    

## 2021-08-16 ENCOUNTER — Telehealth: Payer: BC Managed Care – PPO | Admitting: Physician Assistant

## 2021-08-16 DIAGNOSIS — R350 Frequency of micturition: Secondary | ICD-10-CM

## 2021-08-17 ENCOUNTER — Encounter: Payer: BC Managed Care – PPO | Admitting: Physician Assistant

## 2021-08-17 ENCOUNTER — Other Ambulatory Visit (INDEPENDENT_AMBULATORY_CARE_PROVIDER_SITE_OTHER): Payer: BC Managed Care – PPO

## 2021-08-17 ENCOUNTER — Telehealth: Payer: Self-pay

## 2021-08-17 DIAGNOSIS — R3915 Urgency of urination: Secondary | ICD-10-CM

## 2021-08-17 LAB — URINALYSIS, ROUTINE W REFLEX MICROSCOPIC
Bilirubin Urine: NEGATIVE
Ketones, ur: NEGATIVE
Leukocytes,Ua: NEGATIVE
Nitrite: NEGATIVE
Specific Gravity, Urine: 1.02 (ref 1.000–1.030)
Total Protein, Urine: NEGATIVE
Urine Glucose: NEGATIVE
Urobilinogen, UA: 1 (ref 0.0–1.0)
pH: 6 (ref 5.0–8.0)

## 2021-08-17 NOTE — Telephone Encounter (Signed)
Pt is calling in requesting a UA/ Urine Cx. Pt had a VV on 3/4 and 3/6 for urine frequency. Pt states she feels likes she is going to urinate on herself. Pt started nitrofurantoin, macrocrystal-monohydrate, (MACROBID) 100 MG capsule on 3/4 states its not working. ? ?Please advise [phone] ?

## 2021-08-17 NOTE — Telephone Encounter (Signed)
Patient informed that her lab was placed for the urine sample. ?

## 2021-08-17 NOTE — Progress Notes (Signed)
Duplicate evisit earlier. Advised to be seen in person for urine culture as we have already treated with Macrobid and she has not had symptom improvement. Will mark erroneous.  ?

## 2021-08-17 NOTE — Progress Notes (Signed)
Based on what you shared with me, I feel your condition warrants further evaluation and I recommend that you be seen in a face to face visit with your Primary care Provider, gynecologist or at one of our [facility] clinics. ?  ?NOTE: There will be NO CHARGE for this eVisit ?  ?If you are having a true medical emergency please call 911.   ? ?*[facility]             ?[address], [place], [place] [address] ?[phone] ?(*Take patients with no insurance) ? ?*[facility] ?[address] [place],  [place]  [address] ?[phone] ?(*Take patients with no insurance) ? ?[facility]                                                             ?[address], [place], [place], [address] ?[phone] ? ?[facility] [place]                                    ?[address], [address], [place], [place], [address] ?[phone] ? ?[facility] [facility] at [facility] ?[address], [place], [place], [address] ?[phone] ? ?[facility] [employer] [facility]                                 ?[address] [place], [place], [place], [address] ?[phone] ? ?[facility]                                    ?[address], [place], [place], [address] ?[phone] ? ?[facility] [employer] at [facility] ?[address], [address], [place], [place], [address]                             ? ?[facility] ?[address], [address], [place], [place], [address] ?[phone] ? ?Your MyChart E-visit questionnaire answers were reviewed by a board certified advanced clinical practitioner to complete your personal care plan based on your specific symptoms.  Thank you for using e-Visits. ?  ?I provided 5 minutes of non face-to-face time during this encounter for chart review and documentation.  ? ?

## 2021-08-17 NOTE — Telephone Encounter (Signed)
Labs placed for urine sample.  ?

## 2021-08-18 LAB — URINE CULTURE
MICRO NUMBER:: 13098134
Result:: NO GROWTH
SPECIMEN QUALITY:: ADEQUATE

## 2021-08-30 ENCOUNTER — Other Ambulatory Visit: Payer: Self-pay | Admitting: Internal Medicine

## 2021-08-31 ENCOUNTER — Telehealth: Payer: Self-pay | Admitting: Internal Medicine

## 2021-08-31 NOTE — Telephone Encounter (Signed)
PT calls today requesting a refill on their fluticasone (FLONASE) 50 MCG/ACT nasal spray. If possible PT would like prescription sent to the [facility] at [address] ? ?CB: [phone] ?

## 2021-09-01 NOTE — Telephone Encounter (Signed)
Refill was sent to the pt's pharmacy on 08/31/2021 ?

## 2021-09-03 ENCOUNTER — Encounter: Payer: Self-pay | Admitting: Internal Medicine

## 2021-09-03 ENCOUNTER — Ambulatory Visit: Payer: BC Managed Care – PPO | Admitting: Internal Medicine

## 2021-09-03 ENCOUNTER — Other Ambulatory Visit: Payer: Self-pay

## 2021-09-03 VITALS — BP 160/120 | HR 90 | Temp 98.5°F | Ht 66.0 in

## 2021-09-03 DIAGNOSIS — N926 Irregular menstruation, unspecified: Secondary | ICD-10-CM | POA: Insufficient documentation

## 2021-09-03 DIAGNOSIS — N920 Excessive and frequent menstruation with regular cycle: Secondary | ICD-10-CM | POA: Insufficient documentation

## 2021-09-03 DIAGNOSIS — R3915 Urgency of urination: Secondary | ICD-10-CM | POA: Diagnosis not present

## 2021-09-03 DIAGNOSIS — IMO0001 Reserved for inherently not codable concepts without codable children: Secondary | ICD-10-CM | POA: Insufficient documentation

## 2021-09-03 DIAGNOSIS — N946 Dysmenorrhea, unspecified: Secondary | ICD-10-CM | POA: Insufficient documentation

## 2021-09-03 DIAGNOSIS — N951 Menopausal and female climacteric states: Secondary | ICD-10-CM | POA: Insufficient documentation

## 2021-09-03 DIAGNOSIS — I160 Hypertensive urgency: Secondary | ICD-10-CM

## 2021-09-03 LAB — POC URINALSYSI DIPSTICK (AUTOMATED)
Bilirubin, UA: NEGATIVE
Blood, UA: 10
Glucose, UA: NEGATIVE
Ketones, UA: NEGATIVE
Leukocytes, UA: NEGATIVE
Nitrite, UA: NEGATIVE
Protein, UA: NEGATIVE
Spec Grav, UA: 1.015 (ref 1.010–1.025)
Urobilinogen, UA: 0.2 E.U./dL
pH, UA: 8 (ref 5.0–8.0)

## 2021-09-03 MED ORDER — VALSARTAN 160 MG PO TABS: 160.0000 mg | ORAL_TABLET | Freq: Every day | ORAL | 3 refills | Status: DC

## 2021-09-03 NOTE — Assessment & Plan Note (Signed)
POC U/A done without signs of infection. Could be related to her elevated BP lately. Will check urine culture to ensure no bacteria given recently treated with antibiotics. We will work on BP control as well.  ?

## 2021-09-03 NOTE — Progress Notes (Signed)
? ?  Subjective:  ? ?Patient ID: [name], female    DOB: 04/11/1971, 51 y.o.   MRN: [id number] ? ?HPI ?The patient is a 51 YO female coming in for urinary concerns.  ? ?Review of Systems  ?Constitutional: Negative.   ?HENT: Negative.    ?Eyes: Negative.   ?Respiratory:  Negative for cough, chest tightness and shortness of breath.   ?Cardiovascular:  Negative for chest pain, palpitations and leg swelling.  ?Gastrointestinal:  Negative for abdominal distention, abdominal pain, constipation, diarrhea, nausea and vomiting.  ?Genitourinary:  Positive for urgency.  ?Musculoskeletal: Negative.   ?Skin: Negative.   ?Neurological:  Positive for headaches.  ?Psychiatric/Behavioral: Negative.    ? ?Objective:  ?Physical Exam ?Constitutional:   ?   Appearance: She is well-developed.  ?HENT:  ?   Head: Normocephalic and atraumatic.  ?Cardiovascular:  ?   Rate and Rhythm: Normal rate and regular rhythm.  ?Pulmonary:  ?   Effort: Pulmonary effort is normal. No respiratory distress.  ?   Breath sounds: Normal breath sounds. No wheezing or rales.  ?Abdominal:  ?   General: Bowel sounds are normal. There is no distension.  ?   Palpations: Abdomen is soft.  ?   Tenderness: There is no abdominal tenderness. There is no rebound.  ?Musculoskeletal:  ?   Cervical back: Normal range of motion.  ?Skin: ?   General: Skin is warm and dry.  ?Neurological:  ?   Mental Status: She is alert and oriented to person, place, and time.  ?   Coordination: Coordination normal.  ? ? ?Vitals:  ? 09/03/21 0825 09/03/21 0836  ?BP: (!) 180/120 (!) 160/120  ?Pulse: 90   ?Temp: 98.5 ?F (36.9 ?C)   ?TempSrc: Oral   ?SpO2: 97%   ?Height: 5\' 6"  (1.676 m)   ? ? ?This visit occurred during the SARS-CoV-2 public health emergency.  Safety protocols were in place, including screening questions prior to the visit, additional usage of staff PPE, and extensive cleaning of exam room while observing appropriate contact time as indicated for disinfecting solutions.   ? ?Assessment & Plan:  ? ?

## 2021-09-03 NOTE — Patient Instructions (Addendum)
We have sent in valsartan for the blood pressure to take 1 pill in the evening to help the blood pressure stay more level while you are going through this stress.  ? ?The urine looks good today so symptoms may be coming from high blood pressure.  ?

## 2021-09-03 NOTE — Assessment & Plan Note (Signed)
BP extremely elevated today with headache. She has been having headaches daily several times a week recently. Prior good control on hctz 25 mg daily. We will add valsartan 160 mg daily today and follow up with readings in 1-2 weeks. She already has follow up about 4 weeks from now.  ?

## 2021-09-04 ENCOUNTER — Telehealth: Payer: BC Managed Care – PPO | Admitting: Nurse Practitioner

## 2021-09-04 DIAGNOSIS — H1013 Acute atopic conjunctivitis, bilateral: Secondary | ICD-10-CM

## 2021-09-04 LAB — URINE CULTURE

## 2021-09-04 MED ORDER — PREDNISONE 20 MG PO TABS: 40.0000 mg | ORAL_TABLET | Freq: Every day | ORAL | 0 refills | Status: AC

## 2021-09-04 MED ORDER — OLOPATADINE HCL 0.1 % OP SOLN: 1.0000 [drp] | Freq: Two times a day (BID) | OPHTHALMIC | 12 refills | Status: DC

## 2021-09-04 NOTE — Progress Notes (Signed)
? ?Virtual Visit Consent  ? ?[name], you are scheduled for a virtual visit with Mary-Margaret Daphine Deutscher, FNP, a [facility] provider, today.   ?  ?Just as with appointments in the office, your consent must be obtained to participate.  Your consent will be active for this visit and any virtual visit you may have with one of our providers in the next 365 days.   ?  ?If you have a MyChart account, a copy of this consent can be sent to you electronically.  All virtual visits are billed to your insurance company just like a traditional visit in the office.   ? ?As this is a virtual visit, video technology does not allow for your provider to perform a traditional examination.  This may limit your provider's ability to fully assess your condition.  If your provider identifies any concerns that need to be evaluated in person or the need to arrange testing (such as labs, EKG, etc.), we will make arrangements to do so.   ?  ?Although advances in technology are sophisticated, we cannot ensure that it will always work on either your end or our end.  If the connection with a video visit is poor, the visit may have to be switched to a telephone visit.  With either a video or telephone visit, we are not always able to ensure that we have a secure connection.    ? ?I need to obtain your verbal consent now.   Are you willing to proceed with your visit today? YES ?  ?TOYNA ERISMAN has provided verbal consent on 09/04/2021 for a virtual visit (video or telephone). ?  ?Mary-Margaret Daphine Deutscher, FNP  ? ?Date: 09/04/2021 5:15 PM ? ? ?Virtual Visit via Video Note  ? ?I, Mary-Margaret Daphine Deutscher, connected with ORIANNA Holmes ([id number], [birth year]) on 09/04/21 at  5:15 PM EDT by a video-enabled telemedicine application and verified that I am speaking with the correct person using two identifiers. ? ?Location: ?Patient: Virtual Visit Location Patient: Home ?Provider: Virtual Visit Location Provider: Mobile ?  ?I discussed the  limitations of evaluation and management by telemedicine and the availability of in person appointments. The patient expressed understanding and agreed to proceed.   ? ?History of Present Illness: ?[name] is a 51 y.o. who identifies as a female who was assigned female at birth, and is being seen today for itchy eyes. ? ?HPI: Patient says that 4 days ago her eyes started to feel "goopy" anfd irritated. Se though it wa fom alergies and she bought some allergy drops OTC and that only made eyes burn. Eyes feel goopy inmornings and itchy al day. Denies any blirred vision. ?  ?Review of Systems  ?Eyes:  Positive for redness. Negative for double vision, pain and discharge.  ? ?Problems:  ?Patient Active Problem List  ? Diagnosis Date Noted  ? Contraception 09/03/2021  ? Dysmenorrhea 09/03/2021  ? Hypertensive urgency 09/03/2021  ? Irregular periods 09/03/2021  ? Menopausal syndrome 09/03/2021  ? Menorrhagia 09/03/2021  ? Urinary urgency 09/03/2021  ? Routine general medical examination at a health care facility 01/27/2016  ? Essential hypertension 08/06/2015  ? Allergic rhinitis 08/06/2015  ? Obesity 08/06/2015  ?  ?Allergies:  ?Allergies  ?Allergen Reactions  ? Tomato   ?  Makes eczema worse  ? ?Medications:  ?Current Outpatient Medications:  ?  Ascorbic Acid (VITAMIN C PO), Take by mouth., Disp: , Rfl:  ?  bisacodyl (DULCOLAX) 5 MG EC tablet, Take 5  mg by mouth once., Disp: , Rfl:  ?  ELDERBERRY PO, Take by mouth., Disp: , Rfl:  ?  fluticasone (FLONASE) 50 MCG/ACT nasal spray, SHAKE LIQUID AND USE 2 SPRAYS IN EACH NOSTRIL DAILY, Disp: 48 g, Rfl: 6 ?  hydrochlorothiazide (HYDRODIURIL) 25 MG tablet, Take 1 tablet (25 mg total) by mouth daily., Disp: 90 tablet, Rfl: 3 ?  levonorgestrel (MIRENA, 52 MG,) 20 MCG/DAY IUD, Mirena 21 mcg/24 hours (8 yrs) 52 mg intrauterine device  Take 1 device every day by intrauterine route., Disp: , Rfl:  ?  Multiple Vitamin (MULITIVITAMIN WITH MINERALS) TABS, Take 1 tablet by  mouth daily.  , Disp: , Rfl:  ?  Nutritional Supplements (ESTROVEN PO), Take by mouth., Disp: , Rfl:  ?  ondansetron (ZOFRAN ODT) 4 MG disintegrating tablet, Take 1 tablet (4 mg total) by mouth every 8 (eight) hours as needed for nausea or vomiting., Disp: 20 tablet, Rfl: 0 ?  valsartan (DIOVAN) 160 MG tablet, Take 1 tablet (160 mg total) by mouth daily., Disp: 90 tablet, Rfl: 3 ? ?Observations/Objective: ?Patient is well-developed, well-nourished in no acute distress.  ?Resting comfortably  at home.  ?Head is normocephalic, atraumatic.  ?No labored breathing.  ?Speech is clear and coherent with logical content.  ?Patient is alert and oriented at baseline.  ?Eyes not clearly visible in video ? ?Assessment and Plan: ? ?[name] in today with chief complaint of itchy eyes ? ? ?1. Allergic conjunctivitis of both eyes ?Avoid rubbing ?Cool compresses ?Go back to flonase daily as prescribed ? ?Meds ordered this encounter  ?Medications  ? olopatadine (PATANOL) 0.1 % ophthalmic solution  ?  Sig: Place 1 drop into both eyes 2 (two) times daily.  ?  Dispense:  5 mL  ?  Refill:  12  ?  Order Specific Question:   Supervising Provider  ?  Answer:   Eber Hong [[id number]]  ? predniSONE (DELTASONE) 20 MG tablet  ?  Sig: Take 2 tablets (40 mg total) by mouth daily with breakfast for 5 days. 2 po daily for 5 days  ?  Dispense:  10 tablet  ?  Refill:  0  ?  Order Specific Question:   Supervising Provider  ?  Answer:   Eber Hong [[id number]]  ? ? ? ? ?Follow Up Instructions: ?I discussed the assessment and treatment plan with the patient. The patient was provided an opportunity to ask questions and all were answered. The patient agreed with the plan and demonstrated an understanding of the instructions.  A copy of instructions were sent to the patient via MyChart. ? ?The patient was advised to call back or seek an in-person evaluation if the symptoms worsen or if the condition fails to improve as anticipated. ? ?Time:  ?I spent  9 minutes with the patient via telehealth technology discussing the above problems/concerns.   ? ?Mary-Margaret Daphine Deutscher, FNP ? ?

## 2021-09-04 NOTE — Patient Instructions (Signed)
Allergic Conjunctivitis, Adult ?Allergic conjunctivitis is inflammation of the clear membrane (conjunctiva) that covers the white part of your eye and the inner surface of your eyelid. ?This condition can make your eye red or pink. It can also make your eye feel itchy. This condition cannot be spread from one person to another person (is not contagious). ?What are the causes? ?This condition is caused by allergens. These are things that can cause an allergic reaction in some people but not in others. Common allergens include: ?Outdoor allergens, such as: ?Pollen, including pollen from grass and weeds. ?Mold. ?Car fumes. ?Indoor allergens, such as: ?Dust. ?Smoke. ?Mold. ?Proteins in a pet's pee (urine), saliva, or dander. ?What increases the risk? ?You are more likely to develop this condition if you have a family history of these things: ?Allergies. ?Conditions that you get because of allergens, such as asthma or inflammation of the skin (eczema). ?What are the signs or symptoms? ?Symptoms of this condition include eyes that are: ?Itchy. ?Red. ?Watery. ?Puffy. ?Your eyes may also: ?Sting or burn. ?Have clear fluid draining from them. ?Have thick mucus coming from them. ?How is this treated? ?This condition may be treated with: ?Cold, wet cloths (cold compresses) to soothe itching and swelling. ?Washing the face to remove allergens. ?Eye drops. These may include: ?Eye drops that block allergies. ?Eye drops that reduce swelling and irritation. ?Steroid eye drops if other treatments have not worked. ?Oral antihistamine medicines. These medicines lessen your allergies. You may need these if eye drops do not help or are difficult to use. ?Follow these instructions at home: ?Eye care ?Place a cool, clean washcloth on your eye for 10-20 minutes. Do this 3-4 times a day. ?Do not touch or rub your eyes. ?Do not wear contact lenses until the inflammation is gone. Wear glasses instead. ?Do not wear eye makeup until the  inflammation is gone. ?General instructions ?Try not to be around things that you are allergic to. ?Take or apply over-the-counter and prescription medicines only as told by your doctor. These include any eye drops. ?Drink enough fluid to keep your pee pale yellow. ?Keep all follow-up visits as told by your doctor. This is important. ?Contact a doctor if: ?Your symptoms get worse. ?Your symptoms do not get better with treatment. ?You have mild eye pain. ?You are sensitive to light. ?You have spots or blisters on your eyes. ?You have pus coming from your eye. ?You have a fever. ?Get help right away if: ?You have redness, swelling, or other symptoms in only one eye. ?You cannot see well. ?You have other vision changes. ?You have very bad eye pain. ?Summary ?Allergic conjunctivitis is caused by allergens. It can make your eye red or pink, and it can make your eye feel itchy. ?This condition cannot be spread from one person to another person (is not contagious). ?Try not to be around things that you are allergic to. ?Take or apply over-the-counter and prescription medicines only as told by your doctor. These include any eye drops. ?Contact your doctor if your symptoms get worse or they do not get better with treatment. ?This information is not intended to replace advice given to you by your health care provider. Make sure you discuss any questions you have with your health care provider. ?Document Revised: 04/22/2019 Document Reviewed: 04/22/2019 ?Elsevier Patient Education ? 2022 Elsevier Inc. ? ?

## 2021-11-03 ENCOUNTER — Ambulatory Visit: Payer: BC Managed Care – PPO | Admitting: Internal Medicine

## 2021-11-10 ENCOUNTER — Ambulatory Visit: Payer: BC Managed Care – PPO | Admitting: Internal Medicine

## 2021-11-24 ENCOUNTER — Ambulatory Visit: Payer: BC Managed Care – PPO | Admitting: Internal Medicine

## 2021-12-03 ENCOUNTER — Encounter: Payer: Self-pay | Admitting: Internal Medicine

## 2021-12-03 ENCOUNTER — Ambulatory Visit: Payer: BC Managed Care – PPO | Admitting: Internal Medicine

## 2021-12-03 VITALS — BP 126/74 | HR 93 | Resp 18 | Ht 66.0 in | Wt 252.8 lb

## 2021-12-03 DIAGNOSIS — I10 Essential (primary) hypertension: Secondary | ICD-10-CM | POA: Diagnosis not present

## 2021-12-03 LAB — BASIC METABOLIC PANEL
BUN: 20 mg/dL (ref 6–23)
CO2: 29 mEq/L (ref 19–32)
Calcium: 9.2 mg/dL (ref 8.4–10.5)
Chloride: 103 mEq/L (ref 96–112)
Creatinine, Ser: 1.04 mg/dL (ref 0.40–1.20)
GFR: 61.61 mL/min (ref >60.00)
Glucose, Bld: 102 mg/dL — ABNORMAL HIGH (ref 70–99)
Potassium: 4.3 mEq/L (ref 3.5–5.1)
Sodium: 139 mEq/L (ref 135–145)

## 2021-12-03 NOTE — Assessment & Plan Note (Signed)
Taking valsartan 160 mg daily. BP at goal. Checking BMP today since this was started about 2 months ago.

## 2021-12-27 ENCOUNTER — Telehealth: Payer: Self-pay | Admitting: *Deleted

## 2021-12-27 ENCOUNTER — Encounter: Payer: Self-pay | Admitting: Internal Medicine

## 2021-12-27 MED ORDER — SEMAGLUTIDE-WEIGHT MANAGEMENT 1 MG/0.5ML ~~LOC~~ SOAJ
1.0000 mg | SUBCUTANEOUS | 0 refills | Status: AC
Start: 2022-02-23 — End: 2022-03-23

## 2021-12-27 MED ORDER — SEMAGLUTIDE-WEIGHT MANAGEMENT 0.5 MG/0.5ML ~~LOC~~ SOAJ: 0.5000 mg | SUBCUTANEOUS | 0 refills | Status: AC

## 2021-12-27 MED ORDER — SEMAGLUTIDE-WEIGHT MANAGEMENT 2.4 MG/0.75ML ~~LOC~~ SOAJ: 2.4000 mg | SUBCUTANEOUS | 0 refills | Status: AC

## 2021-12-27 MED ORDER — SEMAGLUTIDE-WEIGHT MANAGEMENT 1.7 MG/0.75ML ~~LOC~~ SOAJ
1.7000 mg | SUBCUTANEOUS | 0 refills | Status: AC
Start: 2022-03-24 — End: 2022-04-21

## 2021-12-27 MED ORDER — SEMAGLUTIDE-WEIGHT MANAGEMENT 0.25 MG/0.5ML ~~LOC~~ SOAJ: 0.2500 mg | SUBCUTANEOUS | 0 refills | Status: AC

## 2021-12-27 NOTE — Telephone Encounter (Signed)
Pt was on cover-my-meds need PA on Wegovy 2.4MG /0.75ML Submitted PA w/ (Key: [id number]). Rec'd msg Your PA request has been sent to [employer].Marland KitchenRaechel Chute  PZPSUG 1MG /0.5ML submitted w/ (Key ). Rec'd msg Your PA request has been sent to : [email or web address]...[employer]  Raechel Chute 0.5MG /0.5ML  submitted w/ ( Key: [id number]). Rec'd msg Your PA request has been sent to [email or web address]...[employer]  Raechel Chute 1.7MG /0.75ML submitted w/ (Key: [id number])). Rec'd msg Your PA request has been sent to [email or web address]...[employer]

## 2021-12-27 NOTE — Telephone Encounter (Signed)
Closed PA for  [facility] 0.5MG /0.5ML. It states "This request has received a N/A outcome." Closed duplicate request../lmb

## 2021-12-28 NOTE — Telephone Encounter (Signed)
Rec'd determination med has been DENIED. It states This request has received a denial. No alternative given faxed denial to pof...Raechel Chute  CYELYH 2.4MG /0.75ML Wegovy 1MG /0.5ML 1.7MG /0.75ML

## 2022-05-10 ENCOUNTER — Encounter: Payer: Self-pay | Admitting: Internal Medicine

## 2022-05-10 ENCOUNTER — Encounter: Payer: BC Managed Care – PPO | Admitting: Internal Medicine

## 2022-05-10 ENCOUNTER — Ambulatory Visit (INDEPENDENT_AMBULATORY_CARE_PROVIDER_SITE_OTHER): Payer: BC Managed Care – PPO | Admitting: Internal Medicine

## 2022-05-10 VITALS — BP 138/79 | HR 94 | Temp 97.7°F | Ht 66.0 in | Wt 234.0 lb

## 2022-05-10 DIAGNOSIS — I10 Essential (primary) hypertension: Secondary | ICD-10-CM

## 2022-05-10 DIAGNOSIS — Z0000 Encounter for general adult medical examination without abnormal findings: Secondary | ICD-10-CM | POA: Diagnosis not present

## 2022-05-10 DIAGNOSIS — J3089 Other allergic rhinitis: Secondary | ICD-10-CM

## 2022-05-10 DIAGNOSIS — Z23 Encounter for immunization: Secondary | ICD-10-CM

## 2022-05-10 NOTE — Assessment & Plan Note (Signed)
BP readings normal at home. Initially elevated here. She has been taking valsartan 160 mg daily without missing. Reviewed labs from [facility] and they are normal. Will continue at same dosing.

## 2022-05-10 NOTE — Assessment & Plan Note (Signed)
Using flonase and otc allergy medicine with good results. Can refill when needed. No flare today.

## 2022-05-10 NOTE — Progress Notes (Signed)
   Subjective:   Patient ID: [name], female    DOB: 1970-12-19, 51 y.o.   MRN: [id number]  HPI The patient is here for physical.  PMH, [facility], social history reviewed and updated  Review of Systems  Constitutional: Negative.   HENT: Negative.    Eyes: Negative.   Respiratory:  Negative for cough, chest tightness and shortness of breath.   Cardiovascular:  Negative for chest pain, palpitations and leg swelling.  Gastrointestinal:  Negative for abdominal distention, abdominal pain, constipation, diarrhea, nausea and vomiting.  Musculoskeletal: Negative.   Skin: Negative.   Neurological: Negative.   Psychiatric/Behavioral: Negative.      Objective:  Physical Exam Constitutional:      Appearance: She is well-developed.  HENT:     Head: Normocephalic and atraumatic.  Cardiovascular:     Rate and Rhythm: Normal rate and regular rhythm.  Pulmonary:     Effort: Pulmonary effort is normal. No respiratory distress.     Breath sounds: Normal breath sounds. No wheezing or rales.  Abdominal:     General: Bowel sounds are normal. There is no distension.     Palpations: Abdomen is soft.     Tenderness: There is no abdominal tenderness. There is no rebound.  Musculoskeletal:     Cervical back: Normal range of motion.  Skin:    General: Skin is warm and dry.  Neurological:     Mental Status: She is alert and oriented to person, place, and time.     Coordination: Coordination normal.     Vitals:   05/10/22 0831 05/10/22 0840  BP: (!) 180/100 (!) 180/100  Pulse: 94   Temp: 97.7 F (36.5 C)   TempSrc: Oral   SpO2: 99%   Weight: 234 lb (106.1 kg)   Height: 5\' 6"  (1.676 m)     Assessment & Plan:  Flu shot given at visit
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
## 2022-08-12 ENCOUNTER — Emergency Department (HOSPITAL_COMMUNITY)
Admission: EM | Admit: 2022-08-12 | Discharge: 2022-08-12 | Disposition: A | Discharge status: Home / Self Care | Payer: BC Managed Care – PPO | Attending: Emergency Medicine | Admitting: Emergency Medicine

## 2022-08-12 ENCOUNTER — Encounter (HOSPITAL_COMMUNITY): Payer: Self-pay

## 2022-08-12 ENCOUNTER — Other Ambulatory Visit: Payer: Self-pay

## 2022-08-12 ENCOUNTER — Emergency Department (HOSPITAL_COMMUNITY): Payer: BC Managed Care – PPO

## 2022-08-12 DIAGNOSIS — I10 Essential (primary) hypertension: Secondary | ICD-10-CM | POA: Diagnosis not present

## 2022-08-12 DIAGNOSIS — Y92410 Unspecified street and highway as the place of occurrence of the external cause: Secondary | ICD-10-CM | POA: Diagnosis not present

## 2022-08-12 DIAGNOSIS — M25512 Pain in left shoulder: Secondary | ICD-10-CM | POA: Diagnosis present

## 2022-08-12 MED ORDER — ACETAMINOPHEN 325 MG PO TABS
650.0000 mg | ORAL_TABLET | Freq: Once | ORAL | Status: AC
  Administered 2022-08-12: 650 mg via ORAL
  Filled 2022-08-12: qty 2

## 2022-08-12 NOTE — ED Notes (Signed)
Ortho tech called 
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
## 2022-08-12 NOTE — Discharge Instructions (Signed)
The xray of your shoulder shows a perhaps small fracture of the left shoulder. You were placed on a sling for comfort and can follow up with orthopedist on call.  You  may alternate ibuprofen or tylenol to help with your symptoms.
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
## 2022-08-12 NOTE — ED Notes (Signed)
Ortho tech called again and this RN was told 'someone will be down shortly'.

## 2022-08-12 NOTE — Progress Notes (Signed)
Orthopedic Tech Progress Note Patient Details:  [name] [birth year] [id number]  Ortho Devices Type of Ortho Device: Arm sling Ortho Device/Splint Location: LUE Ortho Device/Splint Interventions: Ordered, Application   Post Interventions Patient Tolerated: Well  Shannelle Alguire A Sherissa Tenenbaum 08/12/2022, 10:12 AM

## 2022-08-14 ENCOUNTER — Telehealth: Payer: BC Managed Care – PPO | Admitting: Nurse Practitioner

## 2022-08-14 ENCOUNTER — Telehealth: Payer: BC Managed Care – PPO

## 2022-08-14 DIAGNOSIS — R112 Nausea with vomiting, unspecified: Secondary | ICD-10-CM

## 2022-08-14 MED ORDER — ONDANSETRON 4 MG PO TBDP: 4.0000 mg | ORAL_TABLET | Freq: Three times a day (TID) | ORAL | 0 refills | Status: AC | PRN

## 2022-08-14 NOTE — Progress Notes (Signed)
E-Visit for Nausea and Vomiting   We are sorry that you are not feeling well. Here is how we plan to help!  Based on what you have shared with me it looks like you have a Virus that is irritating your GI tract.  Vomiting is the forceful emptying of a portion of the stomach's content through the mouth.  Although nausea and vomiting can make you feel miserable, it's important to remember that these are not diseases, but rather symptoms of an underlying illness.  When we treat short term symptoms, we always caution that any symptoms that persist should be fully evaluated in a medical office.  I have prescribed a medication that will help alleviate your symptoms and allow you to stay hydrated:  Zofran 4 mg 1 tablet every 8 hours as needed for nausea and vomiting  HOME CARE: Drink clear liquids.  This is very important! Dehydration (the lack of fluid) can lead to a serious complication.  Start off with 1 tablespoon every 5 minutes for 8 hours. You may begin eating bland foods after 8 hours without vomiting.  Start with saltine crackers, white bread, rice, mashed potatoes, applesauce. After 48 hours on a bland diet, you may resume a normal diet. Try to go to sleep.  Sleep often empties the stomach and relieves the need to vomit.  GET HELP RIGHT AWAY IF:  Your symptoms do not improve or worsen within 2 days after treatment. You have a fever for over 3 days. You cannot keep down fluids after trying the medication.  MAKE SURE YOU:  Understand these instructions. Will watch your condition. Will get help right away if you are not doing well or get worse.    Thank you for choosing an e-visit.  Your e-visit answers were reviewed by a board certified advanced clinical practitioner to complete your personal care plan. Depending upon the condition, your plan could have included both over the counter or prescription medications.  Please review your pharmacy choice. Make sure the pharmacy is open so  you can pick up prescription now. If there is a problem, you may contact your provider through [employer] and have the prescription routed to another pharmacy.  Your safety is important to [place]. If you have drug allergies check your prescription carefully.   For the next 24 hours you can use MyChart to ask questions about today's visit, request a non-urgent call back, or ask for a work or school excuse. You will get an email in the next two days asking about your experience. I hope that your e-visit has been valuable and will speed your recovery.

## 2022-08-14 NOTE — Progress Notes (Signed)
I have spent 5 minutes in review of e-visit questionnaire, review and updating patient chart, medical decision making and response to patient.  ° °Anhthu Perdew W Miklo Aken, NP ° °  °

## 2022-09-09 ENCOUNTER — Other Ambulatory Visit: Payer: Self-pay | Admitting: Internal Medicine

## 2022-10-04 ENCOUNTER — Other Ambulatory Visit: Payer: Self-pay | Admitting: Internal Medicine

## 2022-11-15 ENCOUNTER — Ambulatory Visit: Payer: BC Managed Care – PPO | Admitting: Internal Medicine

## 2022-11-15 ENCOUNTER — Encounter: Payer: Self-pay | Admitting: Internal Medicine

## 2022-11-15 VITALS — BP 160/100 | HR 93 | Temp 97.7°F | Ht 66.0 in | Wt 235.0 lb

## 2022-11-15 DIAGNOSIS — Z6837 Body mass index (BMI) 37.0-37.9, adult: Secondary | ICD-10-CM

## 2022-11-15 DIAGNOSIS — Z23 Encounter for immunization: Secondary | ICD-10-CM

## 2022-11-15 DIAGNOSIS — E6609 Other obesity due to excess calories: Secondary | ICD-10-CM | POA: Diagnosis not present

## 2022-11-15 DIAGNOSIS — I10 Essential (primary) hypertension: Secondary | ICD-10-CM

## 2022-11-15 NOTE — Patient Instructions (Signed)
We have given you the tetanus shot today.   

## 2022-11-15 NOTE — Progress Notes (Signed)
   Subjective:   Patient ID: [name], female    DOB: 10-20-1970, 52 y.o.   MRN: [id number]  HPI The patient is a 52 YO female coming in for follow up.   Review of Systems  Constitutional: Negative.   HENT: Negative.    Eyes: Negative.   Respiratory:  Negative for cough, chest tightness and shortness of breath.   Cardiovascular:  Negative for chest pain, palpitations and leg swelling.  Gastrointestinal:  Negative for abdominal distention, abdominal pain, constipation, diarrhea, nausea and vomiting.  Musculoskeletal: Negative.   Skin: Negative.   Neurological: Negative.   Psychiatric/Behavioral: Negative.      Objective:  Physical Exam Constitutional:      Appearance: She is well-developed.  HENT:     Head: Normocephalic and atraumatic.  Cardiovascular:     Rate and Rhythm: Normal rate and regular rhythm.  Pulmonary:     Effort: Pulmonary effort is normal. No respiratory distress.     Breath sounds: Normal breath sounds. No wheezing or rales.  Abdominal:     General: Bowel sounds are normal. There is no distension.     Palpations: Abdomen is soft.     Tenderness: There is no abdominal tenderness. There is no rebound.  Musculoskeletal:     Cervical back: Normal range of motion.  Skin:    General: Skin is warm and dry.  Neurological:     Mental Status: She is alert and oriented to person, place, and time.     Coordination: Coordination normal.     Vitals:   11/15/22 0813 11/15/22 0818  BP: (!) 160/100 (!) 160/100  Pulse: 93   Temp: 97.7 F (36.5 C)   TempSrc: Oral   SpO2: 98%   Weight: 235 lb (106.6 kg)   Height: 5\' 6"  (1.676 m)     Assessment & Plan:  Tdap given at visit

## 2022-11-15 NOTE — Assessment & Plan Note (Signed)
Still seeing blue sky and weight stable. Down 20 pounds since last year. She is working on diet and walking regularly. Encouragement given.

## 2022-11-15 NOTE — Assessment & Plan Note (Signed)
BP mildly elevated today but normal at home and at other places. Will keep valsartan 160 mg daily for now. If BP elevates at home she will let us know.

## 2023-01-10 ENCOUNTER — Other Ambulatory Visit: Payer: Self-pay | Admitting: Nurse Practitioner

## 2023-01-10 DIAGNOSIS — R112 Nausea with vomiting, unspecified: Secondary | ICD-10-CM

## 2023-01-11 NOTE — Telephone Encounter (Signed)
Requested medication (s) are due for refill today: yes  Requested medication (s) are on the active medication list: yes  Last refill:  08/14/22  Future visit scheduled: yes  Notes to clinic:  Unable to refill per protocol, cannot delegate.      Requested Prescriptions  Pending Prescriptions Disp Refills   ondansetron (ZOFRAN-ODT) 4 MG disintegrating tablet [Pharmacy Med Name: ONDANSETRON ODT 4 MG TABLET] 18 tablet 1    Sig: TAKE 1 TABLET BY MOUTH EVERY 8 HOURS AS NEEDED FOR NAUSEA AND VOMITING     Not Delegated - Gastroenterology: Antiemetics - ondansetron Failed - 01/10/2023  9:28 AM      Failed - This refill cannot be delegated      Failed - AST in normal range and within 360 days    AST  Date Value Ref Range Status  05/05/2021 17 0 - 37 U/L Final         Failed - ALT in normal range and within 360 days    ALT  Date Value Ref Range Status  05/05/2021 13 0 - 35 U/L Final         Failed - Valid encounter within last 6 months    Recent Outpatient Visits   None     Future Appointments             In 4 months Myrlene Broker, MD [facility]

## 2023-04-28 ENCOUNTER — Other Ambulatory Visit: Payer: Self-pay | Admitting: Obstetrics and Gynecology

## 2023-04-28 DIAGNOSIS — R928 Other abnormal and inconclusive findings on diagnostic imaging of breast: Secondary | ICD-10-CM

## 2023-05-17 ENCOUNTER — Ambulatory Visit: Payer: BC Managed Care – PPO | Admitting: Internal Medicine

## 2023-05-17 ENCOUNTER — Encounter: Payer: Self-pay | Admitting: Internal Medicine

## 2023-05-17 ENCOUNTER — Ambulatory Visit
Admission: RE | Admit: 2023-05-17 | Discharge: 2023-05-17 | Disposition: A | Discharge status: Home / Self Care | Payer: BC Managed Care – PPO | Source: Ambulatory Visit | Attending: Obstetrics and Gynecology | Admitting: Obstetrics and Gynecology

## 2023-05-17 ENCOUNTER — Other Ambulatory Visit: Payer: Self-pay | Admitting: Obstetrics and Gynecology

## 2023-05-17 VITALS — BP 128/82 | HR 86 | Temp 98.3°F | Ht 66.0 in | Wt 229.8 lb

## 2023-05-17 DIAGNOSIS — N6320 Unspecified lump in the left breast, unspecified quadrant: Secondary | ICD-10-CM

## 2023-05-17 DIAGNOSIS — Z1159 Encounter for screening for other viral diseases: Secondary | ICD-10-CM | POA: Diagnosis not present

## 2023-05-17 DIAGNOSIS — R928 Other abnormal and inconclusive findings on diagnostic imaging of breast: Secondary | ICD-10-CM

## 2023-05-17 DIAGNOSIS — E66812 Obesity, class 2: Secondary | ICD-10-CM | POA: Diagnosis not present

## 2023-05-17 DIAGNOSIS — Z0000 Encounter for general adult medical examination without abnormal findings: Secondary | ICD-10-CM | POA: Diagnosis not present

## 2023-05-17 DIAGNOSIS — E6609 Other obesity due to excess calories: Secondary | ICD-10-CM

## 2023-05-17 DIAGNOSIS — I10 Essential (primary) hypertension: Secondary | ICD-10-CM | POA: Diagnosis not present

## 2023-05-17 DIAGNOSIS — Z6837 Body mass index (BMI) 37.0-37.9, adult: Secondary | ICD-10-CM

## 2023-05-17 LAB — COMPREHENSIVE METABOLIC PANEL
ALT: 15 U/L (ref 0–35)
AST: 16 U/L (ref 0–37)
Albumin: 4.3 g/dL (ref 3.5–5.2)
Alkaline Phosphatase: 51 U/L (ref 39–117)
BUN: 20 mg/dL (ref 6–23)
CO2: 28 meq/L (ref 19–32)
Calcium: 9.5 mg/dL (ref 8.4–10.5)
Chloride: 103 meq/L (ref 96–112)
Creatinine, Ser: 1.07 mg/dL (ref 0.40–1.20)
GFR: 58.94 mL/min — ABNORMAL LOW (ref >60.00)
Glucose, Bld: 92 mg/dL (ref 70–99)
Potassium: 4.1 meq/L (ref 3.5–5.1)
Sodium: 137 meq/L (ref 135–145)
Total Bilirubin: 0.5 mg/dL (ref 0.2–1.2)
Total Protein: 8 g/dL (ref 6.0–8.3)

## 2023-05-17 LAB — CBC
HCT: 40.9 % (ref 36.0–46.0)
Hemoglobin: 13.8 g/dL (ref 12.0–15.0)
MCHC: 33.7 g/dL (ref 30.0–36.0)
MCV: 93.4 fL (ref 78.0–100.0)
Platelets: 395 10*3/uL (ref 150.0–400.0)
RBC: 4.38 Mil/uL (ref 3.87–5.11)
RDW: 12.8 % (ref 11.5–15.5)
WBC: 5.4 10*3/uL (ref 4.0–10.5)

## 2023-05-17 LAB — LIPID PANEL
Cholesterol: 242 mg/dL — ABNORMAL HIGH (ref 0–200)
HDL: 46.1 mg/dL (ref >39.00)
LDL Cholesterol: 181 mg/dL — ABNORMAL HIGH (ref 0–99)
NonHDL: 196.31
Total CHOL/HDL Ratio: 5
Triglycerides: 78 mg/dL (ref 0.0–149.0)
VLDL: 15.6 mg/dL (ref 0.0–40.0)

## 2023-05-17 NOTE — Progress Notes (Signed)
   Subjective:   Patient ID: [name], female    DOB: 10-Mar-1971, 52 y.o.   MRN: [id number]  HPI The patient is here for physical. Having repeat mammo/US left breast later today.  PMH, [facility], social history reviewed and updated  Review of Systems  Constitutional: Negative.   HENT: Negative.    Eyes: Negative.   Respiratory:  Negative for cough, chest tightness and shortness of breath.   Cardiovascular:  Negative for chest pain, palpitations and leg swelling.  Gastrointestinal:  Negative for abdominal distention, abdominal pain, constipation, diarrhea, nausea and vomiting.  Musculoskeletal: Negative.   Skin: Negative.   Neurological: Negative.   Psychiatric/Behavioral: Negative.      Objective:  Physical Exam Constitutional:      Appearance: She is well-developed.  HENT:     Head: Normocephalic and atraumatic.  Cardiovascular:     Rate and Rhythm: Normal rate and regular rhythm.  Pulmonary:     Effort: Pulmonary effort is normal. No respiratory distress.     Breath sounds: Normal breath sounds. No wheezing or rales.  Abdominal:     General: Bowel sounds are normal. There is no distension.     Palpations: Abdomen is soft.     Tenderness: There is no abdominal tenderness. There is no rebound.  Musculoskeletal:     Cervical back: Normal range of motion.  Skin:    General: Skin is warm and dry.  Neurological:     Mental Status: She is alert and oriented to person, place, and time.     Coordination: Coordination normal.     Vitals:   05/17/23 0818  BP: 128/82  Pulse: 86  Temp: 98.3 F (36.8 C)  TempSrc: Oral  SpO2: 100%  Weight: 229 lb 12.8 oz (104.2 kg)  Height: 5\' 6"  (1.676 m)    Assessment & Plan:

## 2023-05-17 NOTE — Assessment & Plan Note (Signed)
Taking semaglutide and naltrexone to help and is down about 6 pounds in last 6 months. Encouraged to stay on track.

## 2023-05-17 NOTE — Assessment & Plan Note (Signed)
BP at goal on valsartan 160 mg daily. Checking CMP and lipid panel and adjust as needed.

## 2023-05-17 NOTE — Assessment & Plan Note (Signed)
Flu shot complete. Shingrix complete. Tetanus up to date. Colonoscopy up to date. Mammogram getting diagnostic today, pap smear up to date need records from ob/gyn. Counseled about sun safety and mole surveillance. Counseled about the dangers of distracted driving. Given 10 year screening recommendations.

## 2023-05-18 LAB — HEPATITIS C ANTIBODY: Hepatitis C Ab: NONREACTIVE

## 2023-05-19 ENCOUNTER — Ambulatory Visit
Admission: RE | Admit: 2023-05-19 | Discharge: 2023-05-19 | Disposition: A | Discharge status: Home / Self Care | Payer: BC Managed Care – PPO | Source: Ambulatory Visit | Attending: Obstetrics and Gynecology | Admitting: Obstetrics and Gynecology

## 2023-05-19 DIAGNOSIS — N6320 Unspecified lump in the left breast, unspecified quadrant: Secondary | ICD-10-CM

## 2023-05-19 HISTORY — PX: BREAST BIOPSY: SHX20

## 2023-05-22 LAB — SURGICAL PATHOLOGY

## 2023-12-24 ENCOUNTER — Other Ambulatory Visit: Payer: Self-pay | Admitting: Internal Medicine

## 2024-05-08 ENCOUNTER — Encounter: Admitting: Internal Medicine

## 2024-05-28 ENCOUNTER — Ambulatory Visit: Payer: Self-pay | Admitting: Internal Medicine

## 2024-05-28 ENCOUNTER — Encounter: Payer: Self-pay | Admitting: Internal Medicine

## 2024-05-28 ENCOUNTER — Ambulatory Visit: Admitting: Internal Medicine

## 2024-05-28 VITALS — BP 132/80 | HR 81 | Temp 97.8°F | Ht 66.0 in | Wt 248.8 lb

## 2024-05-28 DIAGNOSIS — Z23 Encounter for immunization: Secondary | ICD-10-CM

## 2024-05-28 DIAGNOSIS — Z0000 Encounter for general adult medical examination without abnormal findings: Secondary | ICD-10-CM | POA: Diagnosis not present

## 2024-05-28 DIAGNOSIS — I10 Essential (primary) hypertension: Secondary | ICD-10-CM

## 2024-05-28 LAB — TSH: TSH: 1.5 u[IU]/mL (ref 0.35–5.50)

## 2024-05-28 LAB — CBC
HCT: 39.2 % (ref 36.0–46.0)
Hemoglobin: 13.4 g/dL (ref 12.0–15.0)
MCHC: 34.2 g/dL (ref 30.0–36.0)
MCV: 91.2 fl (ref 78.0–100.0)
Platelets: 376 K/uL (ref 150.0–400.0)
RBC: 4.3 Mil/uL (ref 3.87–5.11)
RDW: 13 % (ref 11.5–15.5)
WBC: 4.8 K/uL (ref 4.0–10.5)

## 2024-05-28 LAB — COMPREHENSIVE METABOLIC PANEL WITH GFR
ALT: 14 U/L (ref 0–35)
AST: 17 U/L (ref 5–37)
Albumin: 4.2 g/dL (ref 3.5–5.2)
Alkaline Phosphatase: 54 U/L (ref 39–117)
BUN: 18 mg/dL (ref 6–23)
CO2: 26 meq/L (ref 19–32)
Calcium: 9.2 mg/dL (ref 8.4–10.5)
Chloride: 103 meq/L (ref 96–112)
Creatinine, Ser: 0.94 mg/dL (ref 0.40–1.20)
GFR: 68.35 mL/min (ref >60.00)
Glucose, Bld: 94 mg/dL (ref 70–99)
Potassium: 3.8 meq/L (ref 3.5–5.1)
Sodium: 137 meq/L (ref 135–145)
Total Bilirubin: 0.6 mg/dL (ref 0.2–1.2)
Total Protein: 7.7 g/dL (ref 6.0–8.3)

## 2024-05-28 LAB — LIPID PANEL
Cholesterol: 198 mg/dL (ref 28–200)
HDL: 46.1 mg/dL (ref >39.00)
LDL Cholesterol: 137 mg/dL — ABNORMAL HIGH (ref 0–99)
NonHDL: 152.3
Total CHOL/HDL Ratio: 4
Triglycerides: 75 mg/dL (ref 0.0–149.0)
VLDL: 15 mg/dL (ref 0.0–40.0)

## 2024-05-28 LAB — HEMOGLOBIN A1C: Hgb A1c MFr Bld: 4.7 % (ref 4.6–6.5)

## 2024-05-28 NOTE — Assessment & Plan Note (Addendum)
 Checking lipid panel and HgA1c and TSH. Adjust as needed. BMI 40 and counseled about diet and exercise.

## 2024-05-28 NOTE — Assessment & Plan Note (Signed)
 Flu shot up to date. Pneumonia given 20. Shingrix complete. Tetanus up to date. Colonoscopy up to date. Mammogram up to date with gyn, pap smear up to date with gyn. Counseled about sun safety and mole surveillance. Counseled about the dangers of distracted driving. Given 10 year screening recommendations.

## 2024-05-28 NOTE — Assessment & Plan Note (Signed)
 BP at goal on valsartan  160 mg daily. Checking CMP and adjust as needed.

## 2024-05-28 NOTE — Progress Notes (Signed)
° °  Subjective:   Patient ID: [name], female    DOB: 06-15-1970, 53 y.o.   MRN: [id number]  The patient is here for physical. Pertinent topics discussed: Discussed the use of AI scribe software for clinical note transcription with the patient, who gave verbal consent to proceed.  History of Present Illness [name] is a 53 year old female who presents for a routine follow-up visit.  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 This has contributed to a weight increase since last year.  She has stopped using semaglutide  due to side effects of constipation and hair loss. She is not currently on any weight loss medication.  PMH, [facility], social history reviewed and updated  Review of Systems  Constitutional: Negative.   HENT: Negative.    Eyes: Negative.   Respiratory:  Negative for cough, chest tightness and shortness of breath.   Cardiovascular:  Negative for chest pain, palpitations and leg swelling.  Gastrointestinal:  Negative for abdominal distention, abdominal pain, constipation, diarrhea, nausea and vomiting.  Musculoskeletal: Negative.   Skin: Negative.   Neurological: Negative.   Psychiatric/Behavioral: Negative.      Objective:  Physical Exam Constitutional:      Appearance: She is well-developed. She is obese.  HENT:     Head: Normocephalic and atraumatic.  Cardiovascular:     Rate and Rhythm: Normal rate and regular rhythm.  Pulmonary:     Effort: Pulmonary effort is normal. No respiratory distress.     Breath sounds: Normal breath sounds. No wheezing or rales.  Abdominal:     General: Bowel sounds are normal. There is no distension.     Palpations: Abdomen is soft.     Tenderness: There is no abdominal tenderness.  Musculoskeletal:     Cervical back: Normal range of motion.  Skin:    General: Skin is warm and dry.   Neurological:     Mental Status: She is alert and oriented to person, place, and time.     Coordination: Coordination normal.     Vitals:   05/28/24 0803  BP: 132/80  Pulse: 81  Temp: 97.8 F (36.6 C)  TempSrc: Oral  SpO2: 93%  Weight: 248 lb 12.8 oz (112.9 kg)  Height: 5' 6 (1.676 m)    Assessment & Plan:  Prevnar 20 given at visit

## 2025-05-30 ENCOUNTER — Encounter: Admitting: Internal Medicine
